# Patient Record
Sex: Male | Born: 1995 | Race: White | Hispanic: No | Marital: Single | Smoking: Current every day smoker
Health system: Southern US, Community
[De-identification: ages and names within clinical notes are randomized; demographics above are authoritative.]

## PROBLEM LIST (undated history)

## (undated) DIAGNOSIS — B1920 Unspecified viral hepatitis C without hepatic coma: Secondary | ICD-10-CM

## (undated) HISTORY — PX: TESTICLE SURGERY: SHX794

## (undated) HISTORY — PX: APPENDECTOMY: SHX54

---

## 2019-12-04 ENCOUNTER — Encounter (HOSPITAL_COMMUNITY): Payer: Self-pay

## 2019-12-04 ENCOUNTER — Other Ambulatory Visit: Payer: Self-pay

## 2019-12-04 ENCOUNTER — Emergency Department (HOSPITAL_COMMUNITY): Payer: Self-pay

## 2019-12-04 ENCOUNTER — Emergency Department (HOSPITAL_COMMUNITY)
Admission: EM | Admit: 2019-12-04 | Discharge: 2019-12-04 | Disposition: A | Discharge status: Home / Self Care | Payer: Self-pay | Attending: Emergency Medicine | Admitting: Emergency Medicine

## 2019-12-04 DIAGNOSIS — K6389 Other specified diseases of intestine: Secondary | ICD-10-CM

## 2019-12-04 DIAGNOSIS — K388 Other specified diseases of appendix: Secondary | ICD-10-CM | POA: Insufficient documentation

## 2019-12-04 HISTORY — DX: Unspecified viral hepatitis C without hepatic coma: B19.20

## 2019-12-04 LAB — URINALYSIS, ROUTINE W REFLEX MICROSCOPIC
Bilirubin Urine: NEGATIVE
Glucose, UA: NEGATIVE mg/dL
Hgb urine dipstick: NEGATIVE
Ketones, ur: NEGATIVE mg/dL
Nitrite: NEGATIVE
Protein, ur: NEGATIVE mg/dL
Specific Gravity, Urine: 1.026 (ref 1.005–1.030)
pH: 5 (ref 5.0–8.0)

## 2019-12-04 LAB — CBC
HCT: 43.5 % (ref 39.0–52.0)
Hemoglobin: 14.6 g/dL (ref 13.0–17.0)
MCH: 31.1 pg (ref 26.0–34.0)
MCHC: 33.6 g/dL (ref 30.0–36.0)
MCV: 92.6 fL (ref 80.0–100.0)
Platelets: 208 10*3/uL (ref 150–400)
RBC: 4.7 MIL/uL (ref 4.22–5.81)
RDW: 11.9 % (ref 11.5–15.5)
WBC: 7 10*3/uL (ref 4.0–10.5)
nRBC: 0 % (ref 0.0–0.2)

## 2019-12-04 LAB — COMPREHENSIVE METABOLIC PANEL
ALT: 66 U/L — ABNORMAL HIGH (ref 0–44)
AST: 53 U/L — ABNORMAL HIGH (ref 15–41)
Albumin: 4.6 g/dL (ref 3.5–5.0)
Alkaline Phosphatase: 157 U/L — ABNORMAL HIGH (ref 38–126)
Anion gap: 11 (ref 5–15)
BUN: 18 mg/dL (ref 6–20)
CO2: 25 mmol/L (ref 22–32)
Calcium: 9.4 mg/dL (ref 8.9–10.3)
Chloride: 103 mmol/L (ref 98–111)
Creatinine, Ser: 0.75 mg/dL (ref 0.61–1.24)
GFR calc Af Amer: >60 mL/min (ref >60)
GFR calc non Af Amer: >60 mL/min (ref >60)
Glucose, Bld: 91 mg/dL (ref 70–99)
Potassium: 4.6 mmol/L (ref 3.5–5.1)
Sodium: 139 mmol/L (ref 135–145)
Total Bilirubin: 1 mg/dL (ref 0.3–1.2)
Total Protein: 7.6 g/dL (ref 6.5–8.1)

## 2019-12-04 LAB — LIPASE, BLOOD: Lipase: 21 U/L (ref 11–51)

## 2019-12-04 MED ORDER — SODIUM CHLORIDE 0.9% FLUSH: 3.0000 mL | Freq: Once | INTRAVENOUS | Status: DC

## 2019-12-04 MED ORDER — IOHEXOL 300 MG/ML  SOLN
100.0000 mL | Freq: Once | INTRAMUSCULAR | Status: AC | PRN
  Administered 2019-12-04: 100 mL via INTRAVENOUS

## 2019-12-04 MED ORDER — SODIUM CHLORIDE (PF) 0.9 % IJ SOLN
INTRAMUSCULAR | Status: AC
  Filled 2019-12-04: qty 50

## 2019-12-04 NOTE — ED Triage Notes (Signed)
Patient c/o LLQ abdominal pain that began this AM around 0300 today. Patient denies any N/v/d. Patient states any movement causes him pain.

## 2019-12-04 NOTE — Discharge Instructions (Addendum)
Please take ibuprofen as needed for management of your pain.  I would recommend 600 to 800 mg 2-3 times per day.  Please take this medication with a small amount of food to reduce upset stomach.  Please return to the emergency department with any new or worsening symptoms.  I have given you a referral to Dr. Marin Olp.  He is a Development worker, international aid.  If you find your symptoms have not improved in the next 2 to 3 weeks feel free to give him a call to discuss your diagnosis.  It was a pleasure to meet you.

## 2019-12-04 NOTE — ED Provider Notes (Signed)
[facility]-EMERGENCY DEPT Provider Note   CSN: [id number] Arrival date & time: 12/04/19  0755     History Chief Complaint  Patient presents with   Abdominal Pain    Richard Pennington is a 24 y.o. male.  HPI HPI Comments: Richard Pennington is a 24 y.o. male who presents to the Emergency Department complaining of left lower quadrant abdominal pain that started about 9 hours ago.  Patient states he was lying in bed and rolled over in the middle the night began experiencing exquisite left lower abdominal pain.  His pain worsens with any movement, palpation, deep breathing, coughing.  He has not taken anything for his pain.  He denies any associated symptoms at this time.  No nausea, vomiting, diarrhea, constipation, fevers, chills, URI symptoms, chest pain, shortness of breath, dysuria, hematuria, difficulty urinating, syncope, penile pain, scrotal pain, penile swelling, scrotal swelling.  He reports a history of appendectomy as well as surgery for a left-sided varicocele.  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  He denies any recent drug use.     Past Medical History:  Diagnosis Date   Hepatitis C     There are no problems to display for this patient.   Past Surgical History:  Procedure Laterality Date   APPENDECTOMY     TESTICLE SURGERY         History reviewed. No pertinent family history.  Social History   Tobacco Use   Smoking status: Never Smoker  Vaping Use   Vaping Use: Every day   Substances: Nicotine, Flavoring  Substance Use Topics   Alcohol use: Never   Drug use: Not Currently    Comment: heroin    Home Medications Prior to Admission medications   Not on File    Allergies    Patient has no known allergies.  Review of Systems   Review of Systems  All other systems reviewed and are negative. Ten systems reviewed and are negative for acute  change, except as noted in the HPI.   Physical Exam Updated Vital Signs BP (!) 136/99 (BP Location: Left Arm)    Pulse 69    Temp (!) 97.5 F (36.4 C) (Oral)    Resp 19    Ht 6\' 1"  (1.854 m)    Wt 88.5 kg    SpO2 99%    BMI 25.73 kg/m   Physical Exam Vitals and nursing note reviewed.  Constitutional:      General: He is in acute distress.     Appearance: Normal appearance. He is well-developed and normal weight. He is not ill-appearing, toxic-appearing or diaphoretic.  HENT:     Head: Normocephalic and atraumatic.     Right Ear: External ear normal.     Left Ear: External ear normal.     Nose: Nose normal.     Mouth/Throat:     Mouth: Mucous membranes are moist.     Pharynx: Oropharynx is clear. No oropharyngeal exudate or posterior oropharyngeal erythema.  Eyes:     Extraocular Movements: Extraocular movements intact.  Cardiovascular:     Rate and Rhythm: Normal rate and regular rhythm.     Pulses: Normal pulses.     Heart sounds: Normal heart sounds. No murmur heard.  No friction rub. No gallop.   Pulmonary:     Effort: Pulmonary effort is normal. No respiratory distress.     Breath sounds:  Normal breath sounds. No stridor. No wheezing, rhonchi or rales.  Abdominal:     General: Abdomen is flat. A surgical scar is present. Bowel sounds are normal.     Tenderness: There is abdominal tenderness in the left lower quadrant. There is no right CVA tenderness or left CVA tenderness.     Hernia: No hernia is present.     Comments: Well-healed surgical scar noted to the right lower quadrant as well as left inguinal regions.  Exquisite TTP noted along the left lower quadrant.  No rebound.  No guarding.  Abdomen is soft.  Bowel sounds noted in all 4 abdominal quadrants.  Musculoskeletal:        General: Normal range of motion.     Cervical back: Normal range of motion and neck supple. No tenderness.  Skin:    General: Skin is warm and dry.  Neurological:     General: No focal  deficit present.     Mental Status: He is alert and oriented to person, place, and time.  Psychiatric:        Mood and Affect: Mood normal.        Behavior: Behavior normal.    ED Results / Procedures / Treatments   Labs (all labs ordered are listed, but only abnormal results are displayed) Labs Reviewed  COMPREHENSIVE METABOLIC PANEL - Abnormal; Notable for the following components:      Result Value   AST 53 (*)    ALT 66 (*)    Alkaline Phosphatase 157 (*)    All other components within normal limits  URINALYSIS, ROUTINE W REFLEX MICROSCOPIC - Abnormal; Notable for the following components:   Leukocytes,Ua TRACE (*)    Bacteria, UA RARE (*)    All other components within normal limits  LIPASE, BLOOD  CBC    EKG None  Radiology CT ABDOMEN PELVIS W CONTRAST  Result Date: 12/04/2019 CLINICAL DATA:  Left lower quadrant abdominal pain. EXAM: CT ABDOMEN AND PELVIS WITH CONTRAST TECHNIQUE: Multidetector CT imaging of the abdomen and pelvis was performed using the standard protocol following bolus administration of intravenous contrast. CONTRAST:  162mL OMNIPAQUE IOHEXOL 300 MG/ML  SOLN COMPARISON:  None. FINDINGS: Lower chest: No acute abnormality. Hepatobiliary: No focal liver abnormality is seen. No gallstones, gallbladder wall thickening, or biliary dilatation. Pancreas: Unremarkable. No pancreatic ductal dilatation or surrounding inflammatory changes. Spleen: Normal in size without focal abnormality. Adrenals/Urinary Tract: Adrenal glands are unremarkable. Kidneys are normal, without renal calculi, focal lesion, or hydronephrosis. Bladder is unremarkable. Stomach/Bowel: Stomach is within normal limits. History of prior appendectomy. No evidence of bowel wall thickening, distention, or inflammatory changes. Vascular/Lymphatic: No significant vascular findings are present. No enlarged abdominal or pelvic lymph nodes. Reproductive: Prostate is unremarkable. Other: Focal fat stranding  surrounding an epiploic appendage arising from the descending/sigmoid colon junction (series 2, image 65; series 4, image 32). Trace free fluid in the pelvis. No pneumoperitoneum. Musculoskeletal: No acute or significant osseous findings. IMPRESSION: 1. Acute epiploic appendagitis at the descending/sigmoid colon junction. Electronically Signed   By: Titus Dubin M.D.   On: 12/04/2019 13:22   Procedures Procedures   Medications Ordered in ED Medications  sodium chloride flush (NS) 0.9 % injection 3 mL (3 mLs Intravenous Not Given 12/04/19 1120)    ED Course  I have reviewed the triage vital signs and the nursing notes.  Pertinent labs & imaging results that were available during my care of the patient were reviewed by me and considered in my  medical decision making (see chart for details).    MDM Rules/Calculators/A&P                          Patient is a 24 year old male with a history of hepatitis C who presents with acute left lower quadrant pain.  Physical exam is generally reassuring but patient does have exquisite tenderness in the left lower quadrant with even mild palpation.  Labs are reassuring.  No leukocytosis.  Patient is afebrile.  Not tachycardic.  He is saturating well.  He does have a mild elevation in his AST at 53, ALT is 66, alkaline phosphatase of 157.  This seems consistent with his history of hepatitis C.  Trace leukocytes and rare bacteria in the urine.  No urinary complaints.  CT scan of the abdomen and pelvis was significant for an acute epiploic appendagitis at the descending/sigmoid colon junction.  This appears consistent with the patient's symptoms.  I discussed this with the patient.  I recommended a regimen of ibuprofen.  We discussed dosing.  He was given a referral for general surgery if you find that his symptoms have not improved in 2 to 3 weeks.  He was given return precautions.  His questions were answered and he was amicable to time of discharge.  His  vital signs are stable.  Patient discharged to home/self care.  Condition at discharge: Stable  Note: Portions of this report may have been transcribed using voice recognition software. Every effort was made to ensure accuracy; however, inadvertent computerized transcription errors may be present.    Final Clinical Impression(s) / ED Diagnoses Final diagnoses:  Epiploic appendagitis   Rx / DC Orders ED Discharge Orders    None       Placido Sou, PA-C 12/04/19 1416    Sabas Sous, MD 12/04/19 [phone]

## 2021-03-26 ENCOUNTER — Encounter: Payer: Self-pay | Admitting: Internal Medicine

## 2021-03-26 ENCOUNTER — Other Ambulatory Visit: Payer: Self-pay

## 2021-03-26 ENCOUNTER — Other Ambulatory Visit (HOSPITAL_COMMUNITY): Payer: Self-pay

## 2021-03-26 ENCOUNTER — Telehealth: Payer: Self-pay

## 2021-03-26 ENCOUNTER — Ambulatory Visit (INDEPENDENT_AMBULATORY_CARE_PROVIDER_SITE_OTHER): Payer: Self-pay | Admitting: Internal Medicine

## 2021-03-26 VITALS — BP 122/81 | HR 74 | Temp 98.4°F | Wt 200.0 lb

## 2021-03-26 DIAGNOSIS — B182 Chronic viral hepatitis C: Secondary | ICD-10-CM

## 2021-03-26 NOTE — Progress Notes (Signed)
[facility]                                      [address], [place], [place], [address]                                               Phn. 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 Pt was incarcerated when he found out about HCV status. Last injected heroin 2 years ago. He has been on methadone for 2 years. He lives at [facility] sober living. Has used intranasal cocaine in the past. Tattoos are post HCV diagnosis. Brother who is now sober had IVDA Hx and was HCV positive.   Denies  blood transfusion, sharing of toothbrushes/razors, or sexual contact with known positive partners, military service    He has not received treatment to date Denies any hospitalizations related to liver disease, jaundice, ascites, GI bleeding, mental status changes, abdominal pain and acholic stool.  ROS: Denies yellowish discoloration of sclera and skin, abdominal pain/distension, hematemesis.            Denies cough, fever, chills, nightsweats, nausea, vomiting, diarrhea, constipation, weight loss, recent hospitalizations, rashes, joint complaints, shortness of breath, chest pain, headaches, dysuria .  Current Outpatient Medications on File Prior to Visit  Medication Sig Dispense Refill   methadone (METHADONE HCL INTENSOL) 10 MG/ML solution Take by mouth every 8 (eight) hours.     No current facility-administered medications on file prior to visit.     Allergies  Allergen Reactions   Tree Extract Anaphylaxis    Past Medical History:  Diagnosis Date   Hepatitis C      Past Surgical History:  Procedure Laterality Date   APPENDECTOMY     TESTICLE SURGERY       Social History   Socioeconomic History   Marital status: Single     Spouse name: Not on file   Number of children: Not on file   Years of education: Not on file   Highest education level: Not on file  Occupational History   Not on file  Tobacco Use   Smoking status: Former    Types: Cigarettes   Smokeless tobacco: Never  Vaping Use   Vaping Use: Every day   Substances: Nicotine, Flavoring  Substance and Sexual Activity   Alcohol use: Not Currently   Drug use: Not Currently    Comment: heroin   Sexual activity: Not on file  Other Topics Concern   Not on file  Social History Narrative   Not on file    Physical exam: BP 122/81   Pulse 74   Temp 98.4 F (36.9 C) (Oral)   Wt 90.7 kg   SpO2 100%   BMI  26.39 kg/m   Gen: Alert and oriented x 3, no acute distress HEENT: [place]/AT, PERL, EOMI, no scleral icterus, no pale conjunctivae, hearing normal, oral mucosa moist Neck: Supple, no lymphadenopathy Cardio: Regular rate and rhythm; +S1 and S2; no murmurs, gallops, or rubs Resp: CTAB; no wheezes, rhonchi, or rales GI: Soft, nontender, nondistended, bowel sounds present GU: Musc: Extremities: No cyanosis, clubbing, or edema; +2 PT and DP pulses Skin: No rashes, lesions, or ecchymoses Neuro: No focal deficits Psych: Calm, cooperative   Assessment/Plan:  #Hepatitis C-chronic #Hx of IVDA -Dx 3 years ago, retested at [facility] on 11/23/19 and HCV ab positive Prior treatment: none Interested in treatment: yes -Pt last used IV drugs heroin 2 years ago. Has been on methadone x 2 years and lived at [place] [facility](sober living).  Plan: -Labs and imaging ordered: CBC w diff CMP PT/PTT/INR Hep A and B serologies  HIV  HCV genotype HCV RNA Measure of fibrosis U/S abdominal -Follow-up with pharmacy in 2 weeks to start treatment/review results and 4 weeks from starting treatment -Follow-up with ID provider a the end of treatment.   Counseling done on the following -Natural progression of hep c, transmission (avoid sharing personal hygiene  equipment), prevention, risks of left untreated and treatment options  -Avoid hepatotoxins like alcohol and excessive acetamaminphen (no more than 2 gram a day) -Avoid eating raw sea food -Risks of re-infection  -Hepatitis coinfection and vaccination( Pneumococcal vaccination in the cirrhotics     Electronically signed by:  Danelle Earthly, MD Infectious Diseases  Fax no. [phone]

## 2021-03-26 NOTE — Patient Instructions (Signed)
Return to clinic in 2 weeks to review labs/imaging and medication initiation.

## 2021-03-26 NOTE — Telephone Encounter (Signed)
[facility] Patient Advocate Encounter ? ?Insurance verification completed.   ? ?The patient is uninsured and will need patient assistance for medication. ? ?We can complete the application and will need to meet with the patient for signatures and income documentation. ? ?Hesper Venturella, CPhT ?Specialty Pharmacy Patient Advocate ?[facility] ?Phone: [phone] ?Fax:  [phone]  ?

## 2021-04-08 ENCOUNTER — Ambulatory Visit (HOSPITAL_COMMUNITY)
Admission: RE | Admit: 2021-04-08 | Discharge: 2021-04-08 | Disposition: A | Discharge status: Home / Self Care | Payer: Self-pay | Source: Ambulatory Visit | Attending: Internal Medicine | Admitting: Internal Medicine

## 2021-04-08 ENCOUNTER — Other Ambulatory Visit: Payer: Self-pay

## 2021-04-08 DIAGNOSIS — B182 Chronic viral hepatitis C: Secondary | ICD-10-CM | POA: Insufficient documentation

## 2021-04-09 ENCOUNTER — Ambulatory Visit (INDEPENDENT_AMBULATORY_CARE_PROVIDER_SITE_OTHER): Payer: Self-pay | Admitting: Internal Medicine

## 2021-04-09 ENCOUNTER — Other Ambulatory Visit: Payer: Self-pay

## 2021-04-09 ENCOUNTER — Encounter: Payer: Self-pay | Admitting: Internal Medicine

## 2021-04-09 VITALS — BP 128/94 | HR 78 | Temp 98.0°F | Wt 199.0 lb

## 2021-04-09 DIAGNOSIS — Z23 Encounter for immunization: Secondary | ICD-10-CM

## 2021-04-09 LAB — CBC WITH DIFFERENTIAL/PLATELET
Absolute Monocytes: 599 cells/uL (ref 200–950)
Basophils Absolute: 22 cells/uL (ref 0–200)
Basophils Relative: 0.4 %
Eosinophils Absolute: 140 cells/uL (ref 15–500)
Eosinophils Relative: 2.5 %
HCT: 42.4 % (ref 38.5–50.0)
Hemoglobin: 14.6 g/dL (ref 13.2–17.1)
Lymphs Abs: 1859 cells/uL (ref 850–3900)
MCH: 30.7 pg (ref 27.0–33.0)
MCHC: 34.4 g/dL (ref 32.0–36.0)
MCV: 89.1 fL (ref 80.0–100.0)
MPV: 9.9 fL (ref 7.5–12.5)
Monocytes Relative: 10.7 %
Neutro Abs: 2979 cells/uL (ref 1500–7800)
Neutrophils Relative %: 53.2 %
Platelets: 227 10*3/uL (ref 140–400)
RBC: 4.76 10*6/uL (ref 4.20–5.80)
RDW: 12.8 % (ref 11.0–15.0)
Total Lymphocyte: 33.2 %
WBC: 5.6 10*3/uL (ref 3.8–10.8)

## 2021-04-09 LAB — COMPREHENSIVE METABOLIC PANEL
AG Ratio: 1.8 (calc) (ref 1.0–2.5)
ALT: 40 U/L (ref 9–46)
AST: 33 U/L (ref 10–40)
Albumin: 4.8 g/dL (ref 3.6–5.1)
Alkaline phosphatase (APISO): 124 U/L (ref 36–130)
BUN: 12 mg/dL (ref 7–25)
CO2: 31 mmol/L (ref 20–32)
Calcium: 10.1 mg/dL (ref 8.6–10.3)
Chloride: 102 mmol/L (ref 98–110)
Creat: 0.74 mg/dL (ref 0.60–1.24)
Globulin: 2.6 g/dL (calc) (ref 1.9–3.7)
Glucose, Bld: 86 mg/dL (ref 65–99)
Potassium: 4.5 mmol/L (ref 3.5–5.3)
Sodium: 139 mmol/L (ref 135–146)
Total Bilirubin: 0.4 mg/dL (ref 0.2–1.2)
Total Protein: 7.4 g/dL (ref 6.1–8.1)

## 2021-04-09 LAB — LIVER FIBROSIS, FIBROTEST-ACTITEST
ALT: 41 U/L (ref 9–46)
Alpha-2-Macroglobulin: 218 mg/dL (ref 106–279)
Apolipoprotein A1: 131 mg/dL (ref 94–176)
Bilirubin: 0.5 mg/dL (ref 0.2–1.2)
Fibrosis Score: 0.24
GGT: 41 U/L (ref 3–70)
Haptoglobin: 70 mg/dL (ref 43–212)
Necroinflammat ACT Score: 0.21
Reference ID: 4071041

## 2021-04-09 LAB — AFP TUMOR MARKER: AFP-Tumor Marker: 3.1 ng/mL (ref <6.1)

## 2021-04-09 LAB — HCV RNA, QUANT REAL-TIME PCR W/REFLEX
HCV RNA, PCR, QN (Log): 5.91 LogIU/mL — ABNORMAL HIGH
HCV RNA, PCR, QN: 809000 IU/mL — ABNORMAL HIGH

## 2021-04-09 LAB — HEPATITIS B SURFACE ANTIGEN: Hepatitis B Surface Ag: NONREACTIVE

## 2021-04-09 LAB — PROTIME-INR
INR: 0.9
Prothrombin Time: 9.5 s (ref 9.0–11.5)

## 2021-04-09 LAB — HIV ANTIBODY (ROUTINE TESTING W REFLEX): HIV 1&2 Ab, 4th Generation: NONREACTIVE

## 2021-04-09 LAB — HEPATITIS B SURFACE ANTIBODY,QUALITATIVE: Hep B S Ab: REACTIVE — AB

## 2021-04-09 LAB — HCV RNA,LIPA RFLX NS5A DRUG RESIST: HCV Genotype: 3

## 2021-04-09 LAB — HEPATITIS B CORE ANTIBODY, TOTAL: Hep B Core Total Ab: NONREACTIVE

## 2021-04-09 LAB — HEPATITIS A ANTIBODY, TOTAL: Hepatitis A AB,Total: NONREACTIVE

## 2021-04-09 NOTE — Patient Instructions (Signed)
-  Return to clinic in 2 weeks to pick up meds and visit with pharmacy

## 2021-04-09 NOTE — Progress Notes (Signed)
[facility]                                      [address], [place], [place], [address]                                               Phn. [phone]; Fax: [phone]                                                          Reason for Visit: Hepatitis C    HPI: Richard Pennington is a 25 y.o.old male with IVDA (used over 2 years ago)presents for HCV care. He reports he is ready to start treatment.   ROS: Denies yellowish discoloration of sclera and skin, abdominal pain/distension, hematemesis.  Denis cough, fever, chills, nightsweats, nausea, vomiting, diarrhea, constipation, weight loss, recent hospitalizations, rashes, joint complaints, shortness of breath, chest pain, headaches, dysuria .   Current Outpatient Medications on File Prior to Visit  Medication Sig Dispense Refill   methadone (DOLOPHINE) 10 MG/ML solution Take by mouth every 8 (eight) hours.     No current facility-administered medications on file prior to visit.   Allergies  Allergen Reactions   Tree Extract Anaphylaxis    Past Medical History:  Diagnosis Date   Hepatitis C     Past Surgical History:  Procedure Laterality Date   APPENDECTOMY     TESTICLE SURGERY      Social History   Socioeconomic History   Marital status: Single    Spouse name: Not on file   Number of children: Not on file   Years of education: Not on file   Highest education level: Not on file  Occupational History   Not on file  Tobacco Use   Smoking status: Former    Types: Cigarettes   Smokeless tobacco: Never  Vaping Use   Vaping Use: Every day   Substances: Nicotine, Flavoring  Substance and Sexual Activity   Alcohol use: Not Currently   Drug use: Not Currently    Comment: heroin   Sexual activity: Not on file  Other Topics Concern   Not on file  Social History Narrative   Not on file   Social Determinants of Health   Financial Resource  Strain: Not on file  Food Insecurity: Not on file  Transportation Needs: Not on file  Physical Activity: Not on file  Stress: Not on file  Social Connections: Not on file  Intimate Partner Violence: Not on file    No family history on file.  Physical exam: BP (!) 128/94   Pulse 78   Temp 98 F (36.7 C) (Oral)   Wt 90.3 kg   SpO2 98%   BMI 26.25 kg/m   Gen: Alert and oriented x 3, no acute distress HEENT: [place]/AT, PERL, EOMI, no scleral icterus, no pale conjunctivae, hearing normal, oral mucosa moist Neck: Supple, no lymphadenopathy Cardio: Regular rate and rhythm; +S1 and S2; no murmurs, gallops, or rubs Resp: CTAB; no wheezes, rhonchi, or rales GI: Soft, nontender, nondistended, bowel sounds present GU:  Musc: Extremities: No cyanosis, clubbing, or edema; +2 PT and DP pulses Skin: No rashes, lesions, or ecchymoses Neuro: No focal deficits Psych: Calm, cooperative   Laboratory  CBC w diff-03/26/21 CMP- 03/26/21 PT/PTT/INR-03/26/21 Hep A- order vaccine today 04/09/21 HBV immune:03/26/21 HIV: negative 03/26/21 HCV genotype-3: 03/26/21 HCV RNA-809,000 on 03/26/21 Measure of fibrosis-fibrotest F0-F1 on 03/26/21  Assessment/Plan:  #Hepatitis C-Chronic Prior treatment: none GT: 3 Evidence of cirrhosis: none. U/S on 04/08/21: possible fatty infiltration, no focal liver lesions Interested in treatment: Yes Fibrosis stage: Fo-F1 no fibrosis Plan: -HAV vaccine today -Filled out prior auth for mavyret x 8 weeks -Next visit with pharmcy in 2 weeks to pick up meds and counseling. Obtain lipid panel at next visit -Vist with ID provider at the end of treatment  #Hx of IVDA -Pt last used IV drugs heroin 2 years ago. Has been on methadone x 2 years and lived at [place] house(sober living).     Counseling done on the following -Natural progression of hep c, transmission (avoid sharing personal hygiene equipment), prevention, risks of left untreated and treatment options   -Avoid hepatotoxins like alcohol and excessive acetamaminphen (no more than 2 gram a day) -Avoid eating raw sea food -Risks of re-infection  -Hepatitis coinfection and vaccination   Electronically signed by:  Danelle Earthly, MD Infectious Diseases  Fax no. [phone]

## 2021-04-10 ENCOUNTER — Telehealth: Payer: Self-pay

## 2021-04-10 NOTE — Telephone Encounter (Signed)
[facility] Patient Advocate Encounter  Completed and sent MY[employer]IE application for Mavyret for this patient who is uninsured.    Patient assistance phone number for follow up is [phone].   This encounter will be updated until final determination.   Eilis Chestnutt, CPhT Specialty Pharmacy Patient Advocate [facility] Phone: [phone] Fax:  [phone]  

## 2021-04-17 ENCOUNTER — Telehealth: Payer: Self-pay

## 2021-04-17 NOTE — Telephone Encounter (Signed)
Will counsel on Mavyret at appointment. Thanks!

## 2021-04-17 NOTE — Telephone Encounter (Signed)
[facility] Patient Advocate Encounter  Completed and sent MYABBVIE application for Mavyret for this patient who is uninsured.    Patient is approved 04/15/21 through 10/13/21.  Medication will be delivered to the clinic on 04/22/21.    Clearance Coots, CPhT Specialty Pharmacy Patient [facility] Phone: [phone] Fax:  [phone]

## 2021-04-22 ENCOUNTER — Telehealth: Payer: Self-pay

## 2021-04-22 NOTE — Telephone Encounter (Addendum)
[facility] Patient Advocate Encounter  Patient's medications have been couriered to [facility] from [facility] and will be picked up 04/23/21.  Clearance Coots , CPhT Specialty Pharmacy Patient [facility] Phone: [phone] Fax:  [phone]

## 2021-04-23 ENCOUNTER — Ambulatory Visit (INDEPENDENT_AMBULATORY_CARE_PROVIDER_SITE_OTHER): Payer: Self-pay | Admitting: Pharmacist

## 2021-04-23 ENCOUNTER — Other Ambulatory Visit: Payer: Self-pay

## 2021-04-23 DIAGNOSIS — B182 Chronic viral hepatitis C: Secondary | ICD-10-CM

## 2021-04-23 MED ORDER — MAVYRET 100-40 MG PO TABS: 3.0000 | ORAL_TABLET | Freq: Every day | ORAL | 1 refills | Status: DC

## 2021-04-23 NOTE — Progress Notes (Signed)
Richard Pennington presents to clinic today to pick up his Hepatitis C medication. He will be starting on Mavyret for 8 weeks.   Patient is approved to receive Mavyret x 8 weeks for chronic Hepatitis C infection. Counseled patient to take all three tablets of Mavyret daily with food.  Counseled patient the need to take all three tablets together and to not separate them out during the day. Encouraged patient not to miss any doses and explained how their chance of cure could go down with each dose missed. Counseled patient on what to do if dose is missed - if it is closer to the missed dose take immediately; if closer to next dose then skip dose and take the next dose at the usual time. Counseled patient on common side effects such as headache, fatigue, and nausea and that these normally decrease with time. I reviewed patient medications and found no drug interactions. Discussed with patient that there are several drug interactions with Mavyret and instructed patient to call the clinic if he wishes to start a new medication during course of therapy. Also advised patient to call if he experiences any side effects. Patient will follow-up with Marchelle Folks in 1 month on 05/23/21 @ 1:45.   Shirlee More, PharmD PGY2 Infectious Diseases Pharmacy Resident

## 2021-05-16 ENCOUNTER — Telehealth: Payer: Self-pay

## 2021-05-16 NOTE — Telephone Encounter (Signed)
[facility] Patient Advocate Encounter  Patient's medications have been couriered to [facility] from [facility] and will be picked up 05/16/21.  Clearance Coots , CPhT Specialty Pharmacy Patient [facility] Phone: [phone] Fax:  [phone]

## 2021-05-23 ENCOUNTER — Ambulatory Visit (INDEPENDENT_AMBULATORY_CARE_PROVIDER_SITE_OTHER): Payer: Self-pay | Admitting: Pharmacist

## 2021-05-23 ENCOUNTER — Other Ambulatory Visit: Payer: Self-pay

## 2021-05-23 DIAGNOSIS — B182 Chronic viral hepatitis C: Secondary | ICD-10-CM

## 2021-05-23 NOTE — Progress Notes (Signed)
HPI: Richard Pennington is a 25 y.o. male who presents to the [facility] pharmacy clinic for Hepatitis C follow-up.  Medication: Mavyret x 8 weeks  Start Date: 04/23/21  Hepatitis C Genotype: 3  Fibrosis Score: F0-F1  Hepatitis C RNA: 809,000 (03/26/21)  There are no problems to display for this patient.   Patient's Medications  New Prescriptions   No medications on file  Previous Medications   GLECAPREVIR-PIBRENTASVIR (MAVYRET) 100-40 MG TABS    Take 3 tablets by mouth daily with breakfast.   METHADONE (DOLOPHINE) 10 MG/ML SOLUTION    Take by mouth every 8 (eight) hours.  Modified Medications   No medications on file  Discontinued Medications   No medications on file    Allergies: Allergies  Allergen Reactions   Tree Extract Anaphylaxis    Past Medical History: Past Medical History:  Diagnosis Date   Hepatitis C     Social History: Social History   Socioeconomic History   Marital status: Single    Spouse name: Not on file   Number of children: Not on file   Years of education: Not on file   Highest education level: Not on file  Occupational History   Not on file  Tobacco Use   Smoking status: Former    Types: Cigarettes   Smokeless tobacco: Never  Vaping Use   Vaping Use: Every day   Substances: Nicotine, Flavoring  Substance and Sexual Activity   Alcohol use: Not Currently   Drug use: Not Currently    Comment: heroin   Sexual activity: Not on file  Other Topics Concern   Not on file  Social History Narrative   Not on file   Social Determinants of Health   Financial Resource Strain: Not on file  Food Insecurity: Not on file  Transportation Needs: Not on file  Physical Activity: Not on file  Stress: Not on file  Social Connections: Not on file    Labs: Hepatitis C Lab Results  Component Value Date   HCVGENOTYPE 3 03/26/2021   HCVRNAPCRQN 809,000 (H) 03/26/2021   FIBROSTAGE F0-F1 03/26/2021   Hepatitis B Lab Results  Component Value  Date   HEPBSAB REACTIVE (A) 03/26/2021   HEPBSAG NON-REACTIVE 03/26/2021   HEPBCAB NON-REACTIVE 03/26/2021   Hepatitis A Lab Results  Component Value Date   HAV NON-REACTIVE 03/26/2021   HIV Lab Results  Component Value Date   HIV NON-REACTIVE 03/26/2021   Lab Results  Component Value Date   CREATININE 0.74 03/26/2021   CREATININE 0.75 12/04/2019   Lab Results  Component Value Date   AST 33 03/26/2021   AST 53 (H) 12/04/2019   ALT 41 03/26/2021   ALT 40 03/26/2021   ALT 66 (H) 12/04/2019   INR 0.9 03/26/2021    Assessment: Richard Pennington presents to clinic today for HCV follow-up. He has been taking Mavyret for 4 weeks and is tolerating the medicine well. He has not missed any doses. He takes his medicine with his dinner each evening. Will check HCV RNA today and follow-up with Dr. Thedore Mins in 4 weeks. Of note, he states he has experienced mild nausea, dizziness, and muscle soreness. He stated he has no problem completed his treatment course with these ongoing side effects. He also already picked up his second month of medicine last week and started it two days ago.   Plan: Check HCV RNA Continue Mavyret x 4 weeks Follow-up with Dr. Thedore Mins on 1/13  Margarite Gouge, PharmD, CPP Clinical Pharmacist Practitioner  Infectious Diseases Clinical Pharmacist [facility] 05/23/2021, 2:09 PM

## 2021-05-27 LAB — HEPATITIS C RNA QUANTITATIVE
HCV Quantitative Log: <1.18 log IU/mL
HCV RNA, PCR, QN: <15 IU/mL

## 2021-06-24 ENCOUNTER — Telehealth: Payer: Self-pay

## 2021-06-24 NOTE — Telephone Encounter (Signed)
Patient called requesting to go over labs from last appointment, relayed that his HCV viral load was undetectable which indicates his medication is working. Reminded him of follow up visit. Patient verbalized understanding and has no further questions.   Sandie Ano, RN

## 2021-06-27 ENCOUNTER — Ambulatory Visit: Payer: Medicaid Other | Admitting: Internal Medicine

## 2021-07-07 ENCOUNTER — Encounter: Payer: Self-pay | Admitting: Internal Medicine

## 2021-07-07 ENCOUNTER — Other Ambulatory Visit: Payer: Self-pay

## 2021-07-07 ENCOUNTER — Ambulatory Visit (INDEPENDENT_AMBULATORY_CARE_PROVIDER_SITE_OTHER): Payer: Self-pay | Admitting: Internal Medicine

## 2021-07-07 VITALS — BP 126/86 | HR 82 | Temp 98.3°F | Ht 73.0 in | Wt 195.0 lb

## 2021-07-07 DIAGNOSIS — B182 Chronic viral hepatitis C: Secondary | ICD-10-CM

## 2021-07-07 NOTE — Progress Notes (Signed)
There are no problems to display for this patient.   Patient's Medications  New Prescriptions   No medications on file  Previous Medications   GLECAPREVIR-PIBRENTASVIR (MAVYRET) 100-40 MG TABS    Take 3 tablets by mouth daily with breakfast.   METHADONE (DOLOPHINE) 10 MG/ML SOLUTION    Take by mouth every 8 (eight) hours.  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 He completed 8 weeks of Mavyret on 06/23/21. Reports 100% adherence.    ROS: Denies yellowish discoloration of sclera and skin, abdominal pain/distension, hematemesis.  Denis cough, fever, chills, nightsweats, nausea, vomiting, diarrhea, constipation, weight loss, recent hospitalizations, rashes, joint complaints, shortness of breath, chest pain, headaches, dysuria .  Review of Systems: Review of Systems  All other systems reviewed and are negative.  Past Medical History:  Diagnosis Date   Hepatitis C     Social History   Tobacco Use   Smoking status: Every Day    Packs/day: 0.10    Types: Cigarettes   Smokeless tobacco: Never   Tobacco comments:    2 cigarettes a day  Vaping Use   Vaping Use: Every day   Substances: Nicotine, Flavoring  Substance Use Topics   Alcohol use: Not Currently   Drug use: Not Currently    Comment: heroin    No family history on file.  Allergies  Allergen Reactions   Tree Extract Anaphylaxis    Health Maintenance  Topic Date Due   COVID-19 Vaccine (1) Never done   HPV VACCINES (1 - Male 2-dose series) Never done   TETANUS/TDAP  Never done   INFLUENZA VACCINE  Never done   Hepatitis C Screening  Completed   HIV Screening  Completed    Objective:  Vitals:   07/07/21 1335  BP: 126/86  Pulse: 82  Temp: 98.3 F (36.8 C)  TempSrc: Oral  SpO2: 97%  Weight: 195 lb (88.5 kg)  Height: 6'  1" (1.854 m)   Body mass index is 25.73 kg/m.  Physical Exam Constitutional:      General: He is not in acute distress.    Appearance: He is normal weight. He is not toxic-appearing.  HENT:     Head: Normocephalic and atraumatic.     Right Ear: External ear normal.     Left Ear: External ear normal.     Nose: No congestion or rhinorrhea.     Mouth/Throat:     Mouth: Mucous membranes are moist.     Pharynx: Oropharynx is clear.  Eyes:     Extraocular Movements: Extraocular movements intact.     Conjunctiva/sclera: Conjunctivae normal.     Pupils: Pupils are equal, round, and reactive to light.  Cardiovascular:     Rate and Rhythm: Normal rate and regular rhythm.     Heart sounds: No murmur heard.   No friction rub. No gallop.  Pulmonary:     Effort: Pulmonary effort is normal.     Breath sounds: Normal breath sounds.  Abdominal:     General: Abdomen is flat. Bowel sounds are normal.     Palpations: Abdomen is soft.  Musculoskeletal:        General: No swelling. Normal range of motion.     Cervical back: Normal range of motion and neck supple.  Skin:    General: Skin is warm and dry.  Neurological:     General: No focal deficit present.     Mental Status: He is oriented to person, place, and time.  Psychiatric:        Mood and Affect: Mood normal.    Lab Results Lab Results  Component Value Date   WBC 5.6 03/26/2021   HGB 14.6 03/26/2021   HCT 42.4 03/26/2021   MCV 89.1 03/26/2021   PLT 227 03/26/2021    Lab Results  Component Value Date   CREATININE 0.74 03/26/2021   BUN 12 03/26/2021   NA 139 03/26/2021   K 4.5 03/26/2021   CL 102 03/26/2021   CO2 31 03/26/2021    Lab Results  Component Value Date   ALT 41 03/26/2021   ALT 40 03/26/2021   AST 33 03/26/2021   GGT 41 03/26/2021   ALKPHOS 157 (H) 12/04/2019   BILITOT 0.4 03/26/2021    No results found for: CHOL, HDL, LDLCALC, LDLDIRECT, TRIG, CHOLHDL No results found for: LABRPR, RPRTITER No  results found for: HIV1RNAQUANT, HIV1RNAVL, CD4TABS Laboratory  CBC w diff-03/26/21 CMP- 03/26/21 PT/PTT/INR-03/26/21 Hep A- order vaccine today 04/09/21 HBV immune:03/26/21 HIV: negative 03/26/21 HCV genotype-3: 03/26/21 HCV RNA-809,000 on 03/26/21, <15 on 05/23/21 Measure of fibrosis-fibrotest F0-F1 on 03/26/21   Assessment/Plan: #Hepatitis C-Chronic SP mavyret x 8 weeks -HCV VL <15 on 05/23/22(4 weeks). Completed 8 weeks on 06/24/21 Prior treatment: none GT: 3 Evidence of cirrhosis: none. 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 Has been on methadone x 2 years and lived at [facility](sober living).        Counseling done on the following -Natural progression of hep c, transmission (avoid sharing personal hygiene equipment), prevention, risks of left untreated and treatment options  -Avoid hepatotoxins like alcohol and excessive acetamaminphen (no more than 2 gram a day) -Avoid eating raw sea food -Risks of re-infection  -Hepatitis coinfection and vaccination   Laurice Record, MD [facility] 07/07/2021, 1:48 PM

## 2021-07-09 LAB — COMPLETE METABOLIC PANEL WITH GFR
AG Ratio: 1.7 (calc) (ref 1.0–2.5)
ALT: 11 U/L (ref 9–46)
AST: 17 U/L (ref 10–40)
Albumin: 4.6 g/dL (ref 3.6–5.1)
Alkaline phosphatase (APISO): 104 U/L (ref 36–130)
BUN: 16 mg/dL (ref 7–25)
CO2: 32 mmol/L (ref 20–32)
Calcium: 10 mg/dL (ref 8.6–10.3)
Chloride: 101 mmol/L (ref 98–110)
Creat: 0.95 mg/dL (ref 0.60–1.24)
Globulin: 2.7 g/dL (calc) (ref 1.9–3.7)
Glucose, Bld: 78 mg/dL (ref 65–99)
Potassium: 4.5 mmol/L (ref 3.5–5.3)
Sodium: 138 mmol/L (ref 135–146)
Total Bilirubin: 0.5 mg/dL (ref 0.2–1.2)
Total Protein: 7.3 g/dL (ref 6.1–8.1)
eGFR: 115 mL/min/{1.73_m2} (ref >=60)

## 2021-07-09 LAB — HEPATITIS C RNA QUANTITATIVE
HCV Quantitative Log: <1.18 log IU/mL
HCV RNA, PCR, QN: <15 IU/mL

## 2021-07-09 LAB — CBC WITH DIFFERENTIAL/PLATELET
Absolute Monocytes: 573 cells/uL (ref 200–950)
Basophils Absolute: 19 cells/uL (ref 0–200)
Basophils Relative: 0.3 %
Eosinophils Absolute: 113 cells/uL (ref 15–500)
Eosinophils Relative: 1.8 %
HCT: 42.4 % (ref 38.5–50.0)
Hemoglobin: 14.6 g/dL (ref 13.2–17.1)
Lymphs Abs: 1985 cells/uL (ref 850–3900)
MCH: 30.7 pg (ref 27.0–33.0)
MCHC: 34.4 g/dL (ref 32.0–36.0)
MCV: 89.3 fL (ref 80.0–100.0)
MPV: 10.6 fL (ref 7.5–12.5)
Monocytes Relative: 9.1 %
Neutro Abs: 3610 cells/uL (ref 1500–7800)
Neutrophils Relative %: 57.3 %
Platelets: 249 10*3/uL (ref 140–400)
RBC: 4.75 10*6/uL (ref 4.20–5.80)
RDW: 12.3 % (ref 11.0–15.0)
Total Lymphocyte: 31.5 %
WBC: 6.3 10*3/uL (ref 3.8–10.8)

## 2021-09-23 ENCOUNTER — Encounter: Payer: Self-pay | Admitting: Emergency Medicine

## 2021-09-23 ENCOUNTER — Ambulatory Visit
Admission: EM | Admit: 2021-09-23 | Discharge: 2021-09-23 | Disposition: A | Discharge status: Home / Self Care | Payer: Medicaid Other | Attending: Emergency Medicine | Admitting: Emergency Medicine

## 2021-09-23 DIAGNOSIS — B9689 Other specified bacterial agents as the cause of diseases classified elsewhere: Secondary | ICD-10-CM

## 2021-09-23 DIAGNOSIS — J302 Other seasonal allergic rhinitis: Secondary | ICD-10-CM

## 2021-09-23 DIAGNOSIS — H109 Unspecified conjunctivitis: Secondary | ICD-10-CM

## 2021-09-23 DIAGNOSIS — Z209 Contact with and (suspected) exposure to unspecified communicable disease: Secondary | ICD-10-CM

## 2021-09-23 MED ORDER — CIPROFLOXACIN HCL 0.3 % OP SOLN: OPHTHALMIC | 0 refills | Status: AC

## 2021-09-23 MED ORDER — CETIRIZINE HCL 10 MG PO TABS: 10.0000 mg | ORAL_TABLET | Freq: Every day | ORAL | 2 refills | Status: AC

## 2021-09-23 MED ORDER — OLOPATADINE HCL 0.2 % OP SOLN: 1.0000 [drp] | Freq: Every day | OPHTHALMIC | 0 refills | Status: AC

## 2021-09-23 MED ORDER — FLUTICASONE PROPIONATE 50 MCG/ACT NA SUSP: 1.0000 | Freq: Every day | NASAL | 1 refills | Status: AC

## 2021-09-23 NOTE — ED Provider Notes (Signed)
?[facility] ? ? ? ?CSN: [id number] ?Arrival date & time: 09/23/21  1226 ?  ? ?HISTORY  ? ?Chief Complaint  ?Patient presents with  ? Eye Pain  ? ?HPI ?Richard Pennington is a 26 y.o. male. Patient reports being exposed to bacterial conjunctivitis, states his girlfriend's daughter had it last week and he is pretty sure he caught it from her.  Patient states his bilateral eye pain began last night, denies traumatic injury to eye.  Patient states that the pain, redness and discharge began in his left eye and has since migrated to his right.  Patient states has not tried anything to relieve his symptoms.  Patient states he has had "copious" drainage from both eyes.  Patient states he is also sensitive to light, request lights in the room dimmed during our visit today. ? ?The history is provided by the patient.  ?Past Medical History:  ?Diagnosis Date  ? Hepatitis C   ? ?There are no problems to display for this patient. ? ?Past Surgical History:  ?Procedure Laterality Date  ? APPENDECTOMY    ? TESTICLE SURGERY    ? ? ?Home Medications   ? ?Prior to Admission medications   ?Medication Sig Start Date End Date Taking? Authorizing Provider  ?methadone (DOLOPHINE) 10 MG/ML solution Take by mouth every 8 (eight) hours.   Yes [provider]  ? ?Family History ?History reviewed. No pertinent family history. ?Social History ?Social History  ? ?Tobacco Use  ? Smoking status: Every Day  ?  Packs/day: 0.10  ?  Types: Cigarettes  ? Smokeless tobacco: Never  ? Tobacco comments:  ?  2 cigarettes a day  ?Vaping Use  ? Vaping Use: Every day  ? Substances: Nicotine, Flavoring  ?Substance Use Topics  ? Alcohol use: Not Currently  ? Drug use: Not Currently  ?  Comment: heroin  ? ?Allergies   ?Tree extract ? ?Review of Systems ?Review of Systems ?Pertinent findings noted in history of present illness.  ? ?Physical Exam ?Triage Vital Signs ?ED Triage Vitals  ?Enc Vitals Group  ?   BP 04/11/21 0827 (!) 147/82  ?    Pulse Rate 04/11/21 0827 72  ?   Resp 04/11/21 0827 18  ?   Temp 04/11/21 0827 98.3 ?F (36.8 ?C)  ?   Temp Source 04/11/21 0827 Oral  ?   SpO2 04/11/21 0827 98 %  ?   Weight --   ?   Height --   ?   Head Circumference --   ?   Peak Flow --   ?   Pain Score 04/11/21 0826 5  ?   Pain Loc --   ?   Pain Edu? --   ?   Excl. in GC? --   ?No data found. ? ?Updated Vital Signs ?BP 101/68 (BP Location: Left Arm)   Pulse 71   Temp 98 ?F (36.7 ?C) (Oral)   Resp 12   SpO2 95%  ? ?Physical Exam ?Vitals and nursing note reviewed.  ?Constitutional:   ?   General: He is not in acute distress. ?   Appearance: Normal appearance. He is not ill-appearing.  ?HENT:  ?   Head: Normocephalic and atraumatic.  ?   Salivary Glands: Right salivary gland is not diffusely enlarged or tender. Left salivary gland is not diffusely enlarged or tender.  ?   Right Ear: Hearing, tympanic membrane, ear canal and external ear normal. No drainage. No middle ear effusion. There  is no impacted cerumen. Tympanic membrane is not erythematous or bulging.  ?   Left Ear: Hearing, tympanic membrane, ear canal and external ear normal. No drainage.  No middle ear effusion. There is no impacted cerumen. Tympanic membrane is not erythematous or bulging.  ?   Nose: Mucosal edema, congestion and rhinorrhea present. No nasal deformity or septal deviation.  ?   Right Turbinates: Not enlarged, swollen or pale.  ?   Left Turbinates: Not enlarged, swollen or pale.  ?   Right Sinus: No maxillary sinus tenderness or frontal sinus tenderness.  ?   Left Sinus: No maxillary sinus tenderness or frontal sinus tenderness.  ?   Mouth/Throat:  ?   Lips: Pink. No lesions.  ?   Mouth: Mucous membranes are moist. No oral lesions.  ?   Tongue: No lesions.  ?   Palate: No mass.  ?   Pharynx: Oropharynx is clear. Uvula midline. No pharyngeal swelling, oropharyngeal exudate, posterior oropharyngeal erythema or uvula swelling.  ?   Tonsils: No tonsillar exudate. 0 on the right. 0 on the  left.  ?Eyes:  ?   General: Lids are normal. Lids are everted, no foreign bodies appreciated. Vision grossly intact. Gaze aligned appropriately. No allergic shiner, visual field deficit or scleral icterus.    ?   Right eye: No foreign body, discharge or hordeolum.     ?   Left eye: No foreign body, discharge or hordeolum.  ?   Extraocular Movements: Extraocular movements intact.  ?   Conjunctiva/sclera:  ?   Right eye: Right conjunctiva is injected. No chemosis, exudate or hemorrhage. ?   Left eye: Left conjunctiva is injected. No chemosis, exudate or hemorrhage. ?Neck:  ?   Trachea: Trachea and phonation normal.  ?Cardiovascular:  ?   Rate and Rhythm: Normal rate and regular rhythm.  ?   Pulses: Normal pulses.  ?   Heart sounds: Normal heart sounds. No murmur heard. ?  No friction rub. No gallop.  ?Pulmonary:  ?   Effort: Pulmonary effort is normal. No accessory muscle usage, prolonged expiration or respiratory distress.  ?   Breath sounds: Normal breath sounds. No stridor, decreased air movement or transmitted upper airway sounds. No decreased breath sounds, wheezing, rhonchi or rales.  ?Chest:  ?   Chest wall: No tenderness.  ?Musculoskeletal:     ?   General: Normal range of motion.  ?   Cervical back: Full passive range of motion without pain, normal range of motion and neck supple. Normal range of motion.  ?Lymphadenopathy:  ?   Cervical: No cervical adenopathy.  ?Skin: ?   General: Skin is warm and dry.  ?   Findings: No erythema or rash.  ?Neurological:  ?   General: No focal deficit present.  ?   Mental Status: He is alert and oriented to person, place, and time.  ?Psychiatric:     ?   Mood and Affect: Mood normal.     ?   Behavior: Behavior normal.  ? ? ?Visual Acuity ?Right Eye Distance:   ?Left Eye Distance:   ?Bilateral Distance:   ? ?Right Eye Near:   ?Left Eye Near:    ?Bilateral Near:    ? ?UC Couse / Diagnostics / Procedures:  ?  ?EKG ? ?Radiology ?No results found. ? ?Procedures ?Procedures  (including critical care time) ? ?UC Diagnoses / Final Clinical Impressions(s)   ?I have reviewed the triage vital signs and the nursing notes. ? ?  Pertinent labs & imaging results that were available during my care of the patient were reviewed by me and considered in my medical decision making (see chart for details).   ?Final diagnoses:  ?Seasonal allergic rhinitis, unspecified trigger  ?Bacterial conjunctivitis of both eyes  ?Contact with and suspected exposure to communicable disease  ? ?Based on physical exam findings, patient is clearly suffering from a seasonal allergic rhinitis.  Given that he has had contact with a child with diagnosed bacterial conjunctivitis even though I do not appreciate any thick or purulent discharge on exam today, I will provide patient with antibiotic eyedrops in addition to Pataday eyedrops, Flonase and cetirizine.  Return precautions advised, patient advised to follow-up with ophthalmology for worsening eye pain or lack of resolution of symptoms. ? ?ED Prescriptions   ? ? Medication Sig Dispense Auth. Provider  ? Olopatadine HCl (PATADAY) 0.2 % SOLN Apply 1 drop to eye daily. 2.5 mL Theadora Rama Scales, PA-C  ? fluticasone (FLONASE) 50 MCG/ACT nasal spray Place 1 spray into both nostrils daily. Begin by using 2 sprays in each nare daily for 3 to 5 days, then decrease to 1 spray in each nare daily. 32 mL Theadora Rama Scales, PA-C  ? cetirizine (ZYRTEC ALLERGY) 10 MG tablet Take 1 tablet (10 mg total) by mouth at bedtime. 30 tablet Theadora Rama Scales, PA-C  ? ciprofloxacin (CILOXAN) 0.3 % ophthalmic solution Administer 1 drop in both eyes, every 2 hours, while awake, for 2 days. Then 1 drop, every 4 hours, while awake, for the next 5 days. 10 mL Theadora Rama Scales, PA-C  ? ?  ? ?PDMP not reviewed this encounter. ? ?Pending results:  ?Labs Reviewed - No data to display ? ?Medications Ordered in UC: ?Medications - No data to display ? ?Disposition Upon Discharge:   ?Condition: stable for discharge home ?Home: take medications as prescribed; routine discharge instructions as discussed; follow up as advised. ? ?Patient presented with an acute illness with associated systemic symptoms a

## 2021-09-23 NOTE — Discharge Instructions (Addendum)
To treat you for likely bacterial conjunctivitis given your exposure to bacterial conjunctivitis, please begin ciprofloxacin eye drops as prescribed.  Please read the instructions carefully because the frequency of dosing varies by day. ? ?Your history of seasonal allergies and my physical exam findings are also concerning for exacerbation of your underlying allergies.  It is important that you begin your allergy regiment now and are consistent with taking allergy medications exactly as prescribed.  Allergy medications are preventative and therefore only work well when they are taken daily, not "as needed". ?  ?Please see the list below for recommended medications, dosages and frequencies to provide relief of your current symptoms:   ?  ?Zyrtec (cetirizine): This is an excellent second-generation antihistamine that helps to reduce respiratory inflammatory response to environmental allergens.  In some patients, this medication can cause daytime sleepiness so I recommend that you take 1 tablet daily at bedtime.   ?  ?Flonase (fluticasone): This is a steroid nasal spray that you use once daily, 1 spray in each nare.  This medication does not work well if you decide to use it only used as you feel you need to, it works best used on a daily basis.  After 3 to 5 days of use, you will notice significant reduction of the inflammation and mucus production that is currently being caused by exposure to allergens, whether seasonal or environmental.  The most common side effect of this medication is nosebleeds.  If you experience a nosebleed, please discontinue use for 1 week, then feel free to resume.  I have provided you with a prescription but you can also purchase this medication over-the-counter if your insurance will not cover it. ?  ?Pataday (olopatadine): This is an antihistamine eyedrop that can be used once daily to help relieve dry eyes, itchy eyes and red eyes.  This antihistamine drop not only works for allergic  conjunctivitis but is also very helpful with viral conjunctivitis.  Please do not use this drop more than once a day, for best relief please use this in the morning.   ?  ?If you find that you have not had significant relief of your symptoms in the next 7 to 10 days, please follow-up with your primary care provider or return here to urgent care for repeat evaluation and further recommendations. ?  ?Thank you for visiting urgent care today.  We appreciate the opportunity to participate in your care. ? ?

## 2021-09-23 NOTE — ED Triage Notes (Addendum)
Patient c/o bilateral eye pain that started last night.  ? ?Patient denies eye trauma.  ? ?Patient endorses eye redness and eye drainage.  ? ?Patient endorses pain has worsened. ? ?Patient states pain began in LFT eye and then migrated to RT eye.  ? ?Patient hasn't taken any medications for symptoms.  ?

## 2021-09-25 ENCOUNTER — Other Ambulatory Visit: Payer: Self-pay

## 2021-09-25 ENCOUNTER — Emergency Department (HOSPITAL_COMMUNITY)
Admission: EM | Admit: 2021-09-25 | Discharge: 2021-09-25 | Disposition: A | Discharge status: Home / Self Care | Payer: Medicaid Other | Attending: Emergency Medicine | Admitting: Emergency Medicine

## 2021-09-25 DIAGNOSIS — H1033 Unspecified acute conjunctivitis, bilateral: Secondary | ICD-10-CM

## 2021-09-25 DIAGNOSIS — H53143 Visual discomfort, bilateral: Secondary | ICD-10-CM | POA: Insufficient documentation

## 2021-09-25 MED ORDER — TETRACAINE HCL 0.5 % OP SOLN
1.0000 [drp] | Freq: Once | OPHTHALMIC | Status: AC
Start: 2021-09-25 — End: 2021-09-25
  Administered 2021-09-25: 1 [drp] via OPHTHALMIC
  Filled 2021-09-25: qty 4

## 2021-09-25 MED ORDER — FLUORESCEIN SODIUM 1 MG OP STRP
1.0000 | ORAL_STRIP | Freq: Once | OPHTHALMIC | Status: AC
  Administered 2021-09-25: 1 via OPHTHALMIC
  Filled 2021-09-25: qty 1

## 2021-09-25 NOTE — ED Provider Notes (Signed)
?[facility] EMERGENCY DEPARTMENT ?Provider Note ? ? ?CSN: [id number] ?Arrival date & time: 09/25/21  4235 ? ?  ? ?History ? ?No chief complaint on file. ? ? ?Richard Pennington is a 26 y.o. male. ? ?The history is provided by the patient and medical records. No language interpreter was used.  ? ?26 year old male presenting complaining of eye irritation.  For the past 3 to 4 days patient has had pain to both eyes.  Pain is sharp throbbing with associated redness and drainage come from the eyes.  He endorsed light sensitivity.  Report recent exposure to his girlfriend's daughter who had bacterial conjunctivitis recently.  Patient was also seen at urgent care center 2 days ago for his complaint and subsequently was discharged home with Zyrtec, ciprofloxacin ophthalmic solution as well as Pataday and Flonase.  He returns again with complaints of worsening bilateral eye pain.  He mention the eye discharge did improved.  He does not wear contact lens.  He denies any eye injury.  He denies any joint pain fever or headache. ? ?Home Medications ?Prior to Admission medications   ?Medication Sig Start Date End Date Taking? Authorizing Provider  ?cetirizine (ZYRTEC ALLERGY) 10 MG tablet Take 1 tablet (10 mg total) by mouth at bedtime. 09/23/21 12/22/21  Theadora Rama Scales, PA-C  ?ciprofloxacin (CILOXAN) 0.3 % ophthalmic solution Administer 1 drop in both eyes, every 2 hours, while awake, for 2 days. Then 1 drop, every 4 hours, while awake, for the next 5 days. 09/23/21   Theadora Rama Scales, PA-C  ?fluticasone (FLONASE) 50 MCG/ACT nasal spray Place 1 spray into both nostrils daily. Begin by using 2 sprays in each nare daily for 3 to 5 days, then decrease to 1 spray in each nare daily. 09/23/21   Theadora Rama Scales, PA-C  ?methadone (DOLOPHINE) 10 MG/ML solution Take by mouth every 8 (eight) hours.    [provider]  ?Olopatadine HCl (PATADAY) 0.2 % SOLN Apply 1 drop to eye daily. 09/23/21    Theadora Rama Scales, PA-C  ?   ? ?Allergies    ?Tree extract   ? ?Review of Systems   ?Review of Systems  ?All other systems reviewed and are negative. ? ?Physical Exam ?Updated Vital Signs ?BP 118/72 (BP Location: Left Arm)   Pulse 70   Temp 97.6 ?F (36.4 ?C) (Oral)   Resp 18   SpO2 99%  ?Physical Exam ?Vitals and nursing note reviewed.  ?Constitutional:   ?   General: He is not in acute distress. ?   Appearance: He is well-developed.  ?HENT:  ?   Head: Atraumatic.  ?Eyes:  ?   General: Lids are normal. Lids are everted, no foreign bodies appreciated. Vision grossly intact. Gaze aligned appropriately.     ?   Right eye: No foreign body or discharge.     ?   Left eye: No foreign body or discharge.  ?   Intraocular pressure: Right eye pressure is 18 mmHg. Left eye pressure is 18 mmHg. Measurements were taken using a handheld tonometer. ?   Extraocular Movements: Extraocular movements intact.  ?   Conjunctiva/sclera:  ?   Right eye: Right conjunctiva is injected. Hemorrhage present. No chemosis or exudate. ?   Left eye: Left conjunctiva is injected. Hemorrhage present. No chemosis or exudate. ?   Pupils: Pupils are equal, round, and reactive to light.  ?   Right eye: Pupil is round, reactive and not sluggish. No corneal abrasion or fluorescein  uptake. Seidel exam negative.  ?   Left eye: Pupil is round, reactive and not sluggish. No corneal abrasion or fluorescein uptake. Seidel exam negative. ?   Slit lamp exam: ?   Right eye: Anterior chamber quiet. Photophobia present. No corneal flare, corneal ulcer, foreign body, hyphema, anterior chamber bulge or anterior chamber flares.  ?   Left eye: Anterior chamber quiet. Photophobia present. No corneal flare, corneal ulcer, foreign body, hyphema, hypopyon, anterior chamber bulge or anterior chamber flares.  ?Musculoskeletal:  ?   Cervical back: Neck supple.  ?Skin: ?   Findings: No rash.  ?Neurological:  ?   Mental Status: He is alert.  ? ? ?ED Results / Procedures /  Treatments   ?Labs ?(all labs ordered are listed, but only abnormal results are displayed) ?Labs Reviewed - No data to display ? ?EKG ?None ? ?Radiology ?No results found. ? ?Procedures ?Procedures  ? ? ?Medications Ordered in ED ?Medications  ?fluorescein ophthalmic strip 1 strip (has no administration in time range)  ?tetracaine (PONTOCAINE) 0.5 % ophthalmic solution 1 drop (1 drop Both Eyes Given 09/25/21 0609)  ? ? ?ED Course/ Medical Decision Making/ A&P ?  ?                        ?Medical Decision Making ? ?BP 118/72 (BP Location: Left Arm)   Pulse 70   Temp 97.6 ?F (36.4 ?C) (Oral)   Resp 18   SpO2 99%  ? ?46:18 PM ?26 year old male presenting complaining of bilateral eye pain and eye redness ongoing for the past 2 to 3 days.  Initially seen in urgent care center for this complaint and was prescribed antibiotic eyedrops as well as antihistamine.  He took the medication but reported his eye pain is becoming progressively worse.  He denies any vision changes but does endorse photophobia.  On exam he has injected conjunctival bilaterally with small subconjunctival hemorrhages.   ? ?On exam, he has normal intraocular pressure of 18 bilaterally.  Vision is 20/20.  No fluorescein stain uptake.  However he exhibit exquisite tenderness with light sensitivity ? ?Differential diagnosis including viral conjunctivitis, bacterial conjunctivitis, uveitis, keratitis, acute angle glaucoma, Reiter syndrome. ? ?Appreciate consultation from on-call ophthalmologist, Dr. Nada Libman, who request patient to be seen in his office today for further management.  Patient made aware of plan and agrees with plan.  Patient will follow-up in the office as soon as he gets discharged from here. ? ?Patient was given tetracaine eyedrops which she reported mild improvement of his symptoms but still endorse quite a bit of eye discomfort. ? ? ? ? ? ? ? ?Final Clinical Impression(s) / ED Diagnoses ?Final diagnoses:  ?Acute conjunctivitis of both  eyes, unspecified acute conjunctivitis type  ? ? ?Rx / DC Orders ?ED Discharge Orders   ? ? None  ? ?  ? ? ?  ?Fayrene Helper, PA-C ?09/25/21 1456 ? ?  ?Franne Forts, DO ?09/29/21 1545 ? ?

## 2021-09-25 NOTE — Discharge Instructions (Signed)
Please go straight to ophthalmologist office when you leave here for further care ?

## 2021-09-25 NOTE — ED Triage Notes (Signed)
Pt dx with bilateral pink eye and now having severe pain to both eyes.  ?

## 2021-09-25 NOTE — ED Provider Triage Note (Signed)
?  Emergency Medicine Provider Triage Evaluation Note ? ?MRN:  [id number]  ?Arrival date & time: 09/25/21    ?Medically screening exam initiated at 5:49 AM.   ?CC:   ?No chief complaint on file. ?  ?HPI:  ?Richard Pennington is a 26 y.o. year-old male presents to the ED with chief complaint of bilateral eye pain.  Dx'd with pink eye 2 days ago and taking cipro ophthalmic but reports worsening pain and photophobia. ? ?History provided by  ?ROS:  ?-As included in HPI ?PE:  ? ?Vitals:  ? 09/25/21 0546 09/25/21 0546  ?BP: (!) 140/104   ?Pulse: 92 87  ?Resp: 17   ?Temp:  97.9 ?F (36.6 ?C)  ?SpO2: 100% 99%  ?  ?Uncomfortable appearing ?No respiratory distress ?Bilateral conjunctival erythema ?MDM:  ?Based on signs and symptoms, conjunctivitis, iritis, uveitis is highest on my differential. ?I've ordered tetracaine for comfort in triage to expedite lab/diagnostic workup. ? ?Patient was informed that the remainder of the evaluation will be completed by another provider, this initial triage assessment does not replace that evaluation, and the importance of remaining in the ED until their evaluation is complete. ? ?  ?Roxy Horseman, PA-C ?09/25/21 0551 ? ?

## 2021-10-03 ENCOUNTER — Other Ambulatory Visit: Payer: Self-pay

## 2021-10-03 ENCOUNTER — Ambulatory Visit (INDEPENDENT_AMBULATORY_CARE_PROVIDER_SITE_OTHER): Payer: Self-pay | Admitting: Internal Medicine

## 2021-10-03 ENCOUNTER — Encounter: Payer: Self-pay | Admitting: Internal Medicine

## 2021-10-03 VITALS — BP 133/91 | HR 80 | Temp 98.1°F | Wt 186.0 lb

## 2021-10-03 DIAGNOSIS — B182 Chronic viral hepatitis C: Secondary | ICD-10-CM

## 2021-10-03 NOTE — Progress Notes (Signed)
? ?   ? ? ? ?Patient's Medications  ?New Prescriptions  ? No medications on file  ?Previous Medications  ? CETIRIZINE (ZYRTEC ALLERGY) 10 MG TABLET    Take 1 tablet (10 mg total) by mouth at bedtime.  ? CIPROFLOXACIN (CILOXAN) 0.3 % OPHTHALMIC SOLUTION    Administer 1 drop in both eyes, every 2 hours, while awake, for 2 days. Then 1 drop, every 4 hours, while awake, for the next 5 days.  ? FLUTICASONE (FLONASE) 50 MCG/ACT NASAL SPRAY    Place 1 spray into both nostrils daily. Begin by using 2 sprays in each nare daily for 3 to 5 days, then decrease to 1 spray in each nare daily.  ? METHADONE (DOLOPHINE) 10 MG/ML SOLUTION    Take 55 mg by mouth 2 (two) times daily.  ? NAPROXEN SODIUM (ALEVE) 220 MG TABLET    Take 220 mg by mouth 2 (two) times daily as needed (pain).  ? OLOPATADINE HCL (PATADAY) 0.2 % SOLN    Apply 1 drop to eye daily.  ?Modified Medications  ? No medications on file  ?Discontinued Medications  ? No medications on file  ? ? 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 He completed 8 weeks of Mavyret on 06/23/21. Reports 100% adherence.  ?  ?ROS: Denies yellowish discoloration of sclera and skin, abdominal pain/distension, hematemesis.  ?Denis cough, fever, chills, nightsweats, nausea, vomiting, diarrhea, constipation, weight loss, recent hospitalizations, rashes, joint complaints, shortness of breath, chest pain, headaches, dysuria . ? ?Review of Systems: ?Review of Systems  ?All other systems reviewed and are negative. ? ?Past Medical History:  ?Diagnosis Date  ? Hepatitis C   ? ? ?Social History  ? ?Tobacco Use  ? Smoking status: Every Day  ?  Packs/day: 0.10  ?  Types: Cigarettes  ? Smokeless tobacco: Never  ? Tobacco comments:  ?  2 cigarettes a day  ?Vaping Use  ? Vaping Use: Every day  ? Substances: Nicotine, Flavoring  ?Substance Use Topics  ? Alcohol use: Not Currently  ? Drug use: Not Currently  ?  Comment: heroin  ? ? ?No  family history on file. ? ?Allergies  ?Allergen Reactions  ? Other Anaphylaxis  ?  Tree Nuts  ? Tree Extract Anaphylaxis  ? ? ?Health Maintenance  ?Topic Date Due  ? COVID-19 Vaccine (1) Never done  ? HPV VACCINES (1 - Male 2-dose series) Never done  ? TETANUS/TDAP  Never done  ? INFLUENZA VACCINE  01/13/2022  ? Hepatitis C Screening  Completed  ? HIV Screening  Completed  ? ? ?Objective: ? ?Vitals:  ? 10/03/21 1351  ?Weight: 186 lb (84.4 kg)  ? ?Body mass index is 24.54 kg/m?. ? ?Physical Exam ?Constitutional:   ?   General: He is not in acute distress. ?   Appearance: He is normal weight. He is not toxic-appearing.  ?HENT:  ?   Head: Normocephalic and atraumatic.  ?   Right Ear: External ear normal.  ?   Left Ear: External ear normal.  ?   Nose: No congestion or rhinorrhea.  ?   Mouth/Throat:  ?   Mouth: Mucous membranes are moist.  ?   Pharynx: Oropharynx is clear.  ?Eyes:  ?   Extraocular Movements: Extraocular movements intact.  ?   Conjunctiva/sclera: Conjunctivae normal.  ?   Pupils: Pupils are equal, round, and reactive to light.  ?Cardiovascular:  ?  Rate and Rhythm: Normal rate and regular rhythm.  ?   Heart sounds: No murmur heard. ?  No friction rub. No gallop.  ?Pulmonary:  ?   Effort: Pulmonary effort is normal.  ?   Breath sounds: Normal breath sounds.  ?Abdominal:  ?   General: Abdomen is flat. Bowel sounds are normal.  ?   Palpations: Abdomen is soft.  ?Musculoskeletal:     ?   General: No swelling. Normal range of motion.  ?   Cervical back: Normal range of motion and neck supple.  ?Skin: ?   General: Skin is warm and dry.  ?Neurological:  ?   General: No focal deficit present.  ?   Mental Status: He is oriented to person, place, and time.  ?Psychiatric:     ?   Mood and Affect: Mood normal.  ? ? ?Lab Results ?Lab Results  ?Component Value Date  ? WBC 6.3 07/07/2021  ? HGB 14.6 07/07/2021  ? HCT 42.4 07/07/2021  ? MCV 89.3 07/07/2021  ? PLT 249 07/07/2021  ?  ?Lab Results  ?Component Value Date  ?  CREATININE 0.95 07/07/2021  ? BUN 16 07/07/2021  ? NA 138 07/07/2021  ? K 4.5 07/07/2021  ? CL 101 07/07/2021  ? CO2 32 07/07/2021  ?  ?Lab Results  ?Component Value Date  ? ALT 11 07/07/2021  ? AST 17 07/07/2021  ? GGT 41 03/26/2021  ? ALKPHOS 157 (H) 12/04/2019  ? BILITOT 0.5 07/07/2021  ?  ?No results found for: CHOL, HDL, LDLCALC, LDLDIRECT, TRIG, CHOLHDL ?No results found for: LABRPR, RPRTITER ?No results found for: HIV1RNAQUANT, HIV1RNAVL, CD4TABS ?  ?Assessment/Plan: ?#Hepatitis C-Chronic SP mavyret x 8 weeks ?-HCV VL <15 on 05/23/22(4 weeks). Completed 8 weeks on 06/24/21 ?Prior treatment: none ?GT: 3 ?Evidence of cirrhosis: none. U/S on 04/08/21: possible fatty infiltration, no focal liver lesions ?Fibrosis stage: Fo-F1 no fibrosis ?Plan: ?-HAV 2nd dose due 4/26 or after ?-Labs today: cbc, cmp, hcv rna for SVR ?-Follow-up 6 months given high risk of possible re-infection ?  ?#Hx of IVDA ?-Pt last used IV drugs heroin over  2 years ago. Has been on methadone x 2 + years and lived at [place] [facility](sober living).  ?  ?  ?  ?Counseling done on the following ?-Natural progression of hep c, transmission (avoid sharing personal hygiene equipment), prevention, risks of left untreated and treatment options  ?-Avoid hepatotoxins like alcohol and excessive acetamaminphen (no more than 2 gram a day) ?-Avoid eating raw sea food ?-Risks of re-infection  ?-Hepatitis coinfection and vaccination ? ?Danelle Earthly, MD ?[facility] ?[facility] ?10/03/2021, 1:52 PM  ?

## 2021-10-07 LAB — CBC WITH DIFFERENTIAL/PLATELET
Absolute Monocytes: 548 cells/uL (ref 200–950)
Basophils Absolute: 19 cells/uL (ref 0–200)
Basophils Relative: 0.3 %
Eosinophils Absolute: 32 cells/uL (ref 15–500)
Eosinophils Relative: 0.5 %
HCT: 40.4 % (ref 38.5–50.0)
Hemoglobin: 13.5 g/dL (ref 13.2–17.1)
Lymphs Abs: 1789 cells/uL (ref 850–3900)
MCH: 29.9 pg (ref 27.0–33.0)
MCHC: 33.4 g/dL (ref 32.0–36.0)
MCV: 89.6 fL (ref 80.0–100.0)
MPV: 9.9 fL (ref 7.5–12.5)
Monocytes Relative: 8.7 %
Neutro Abs: 3912 cells/uL (ref 1500–7800)
Neutrophils Relative %: 62.1 %
Platelets: 222 10*3/uL (ref 140–400)
RBC: 4.51 10*6/uL (ref 4.20–5.80)
RDW: 12.8 % (ref 11.0–15.0)
Total Lymphocyte: 28.4 %
WBC: 6.3 10*3/uL (ref 3.8–10.8)

## 2021-10-07 LAB — COMPLETE METABOLIC PANEL WITH GFR
AG Ratio: 1.8 (calc) (ref 1.0–2.5)
ALT: 10 U/L (ref 9–46)
AST: 16 U/L (ref 10–40)
Albumin: 4.6 g/dL (ref 3.6–5.1)
Alkaline phosphatase (APISO): 97 U/L (ref 36–130)
BUN: 16 mg/dL (ref 7–25)
CO2: 29 mmol/L (ref 20–32)
Calcium: 9.6 mg/dL (ref 8.6–10.3)
Chloride: 103 mmol/L (ref 98–110)
Creat: 0.89 mg/dL (ref 0.60–1.24)
Globulin: 2.6 g/dL (calc) (ref 1.9–3.7)
Glucose, Bld: 77 mg/dL (ref 65–99)
Potassium: 3.9 mmol/L (ref 3.5–5.3)
Sodium: 139 mmol/L (ref 135–146)
Total Bilirubin: 0.6 mg/dL (ref 0.2–1.2)
Total Protein: 7.2 g/dL (ref 6.1–8.1)
eGFR: 123 mL/min/{1.73_m2} (ref >=60)

## 2021-10-07 LAB — HEPATITIS C RNA QUANTITATIVE
HCV Quantitative Log: <1.18 log IU/mL
HCV RNA, PCR, QN: <15 IU/mL

## 2021-12-22 IMAGING — CT CT ABD-PELV W/ CM
2 of 4 series · 17 of 46 positions shown, 19 images · IV contrast (OMNIPAQUE 300)
Comparison: None.

CLINICAL DATA: Left lower quadrant abdominal pain.

EXAM:
CT ABDOMEN AND PELVIS WITH CONTRAST
TECHNIQUE: Multidetector CT imaging of the abdomen and pelvis was performed
using the standard protocol following bolus administration of
intravenous contrast.
CONTRAST:  100mL OMNIPAQUE IOHEXOL 300 MG/ML  SOLN

[Series 2: axial st · axial · 0.77mm/px · z∈[+1132,+1592]mm · 14 of 104 slices shown, 16 images]
[im 6/104  soft-tissue]
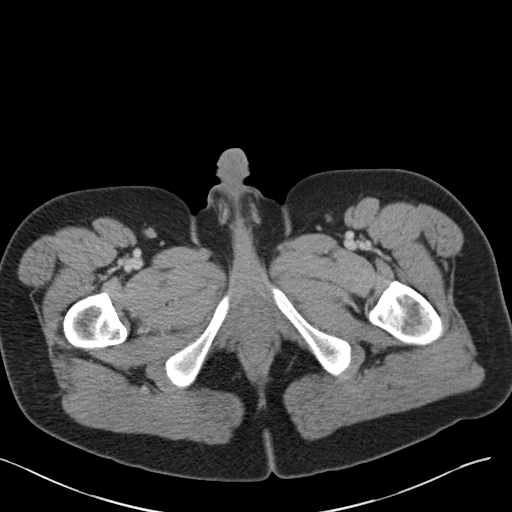
[im 6/104  bone]
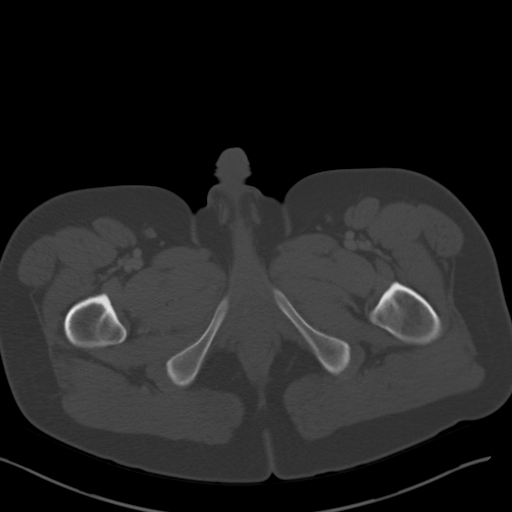
[im 12/104  soft-tissue]
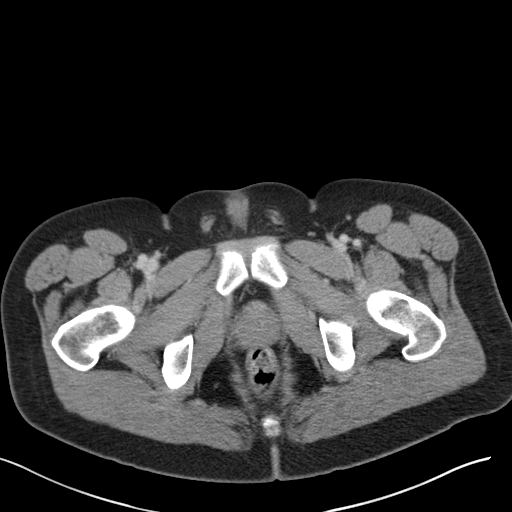
[im 23/104  soft-tissue]
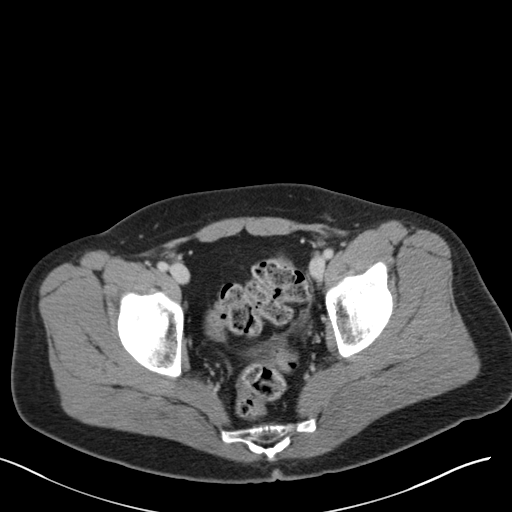
[im 29/104  soft-tissue]
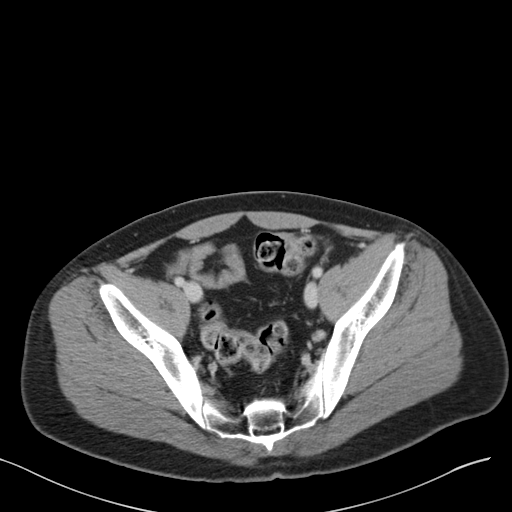
[im 35/104  soft-tissue]
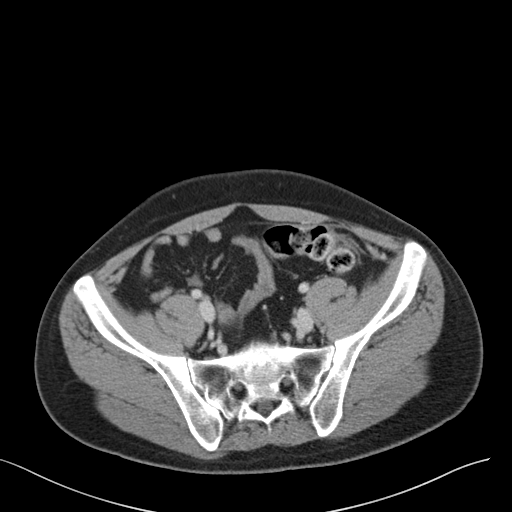
[im 41/104  soft-tissue]
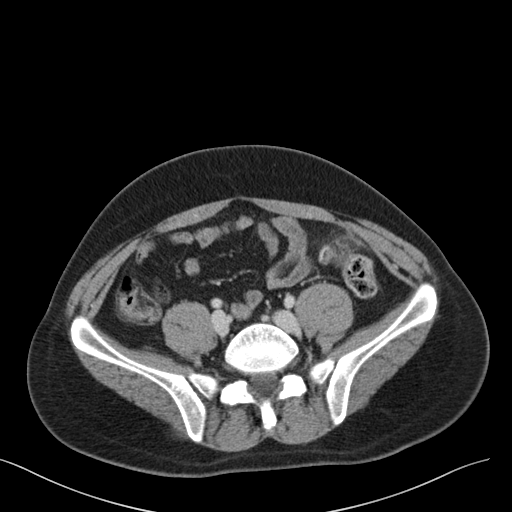
[im 46/104  soft-tissue]
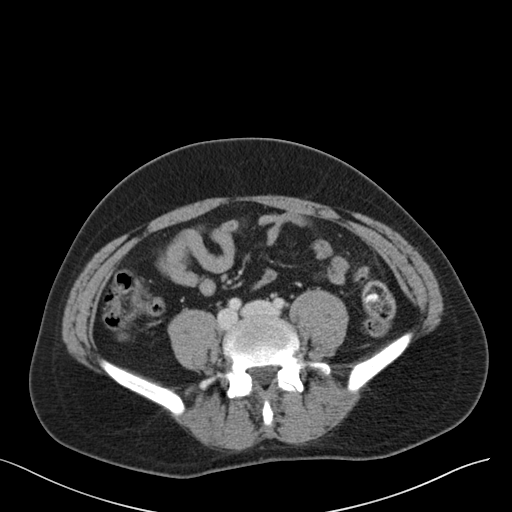
[im 58/104  soft-tissue]
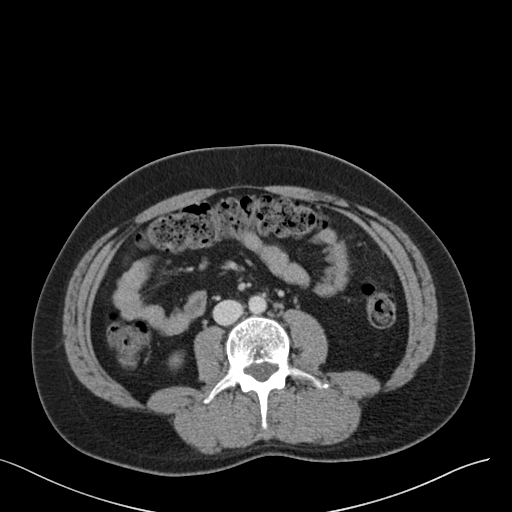
[im 63/104  soft-tissue]
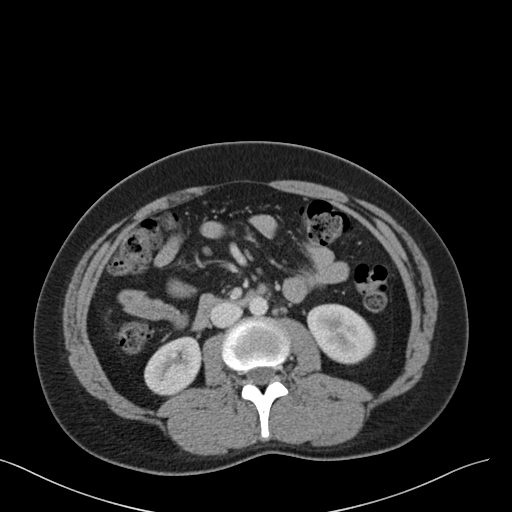
[im 63/104  bone]
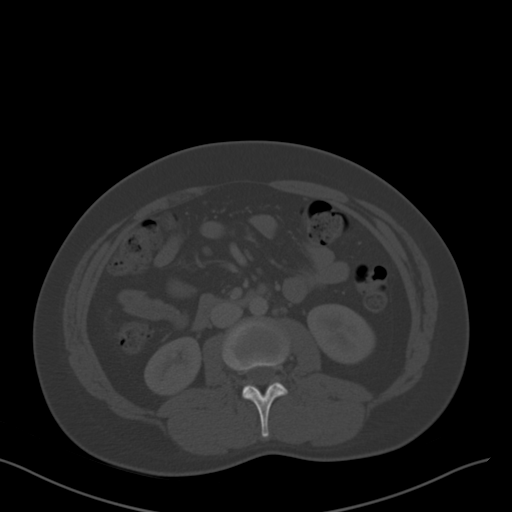
[im 69/104  soft-tissue]
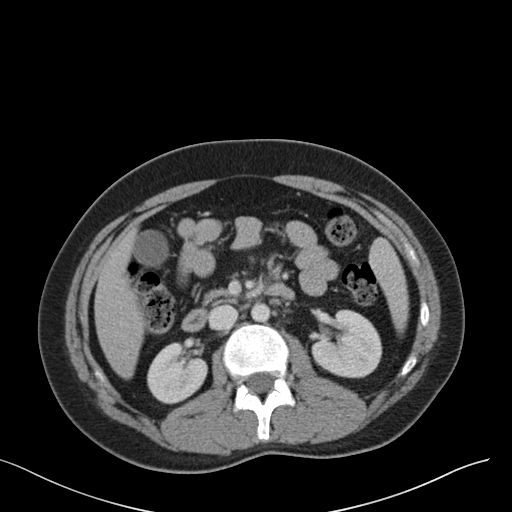
[im 75/104  soft-tissue]
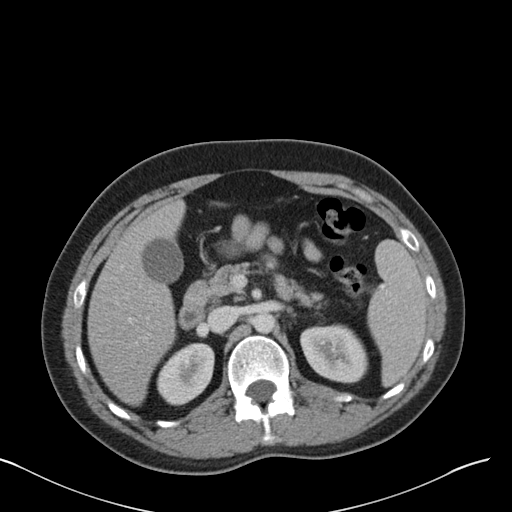
[im 81/104  soft-tissue]
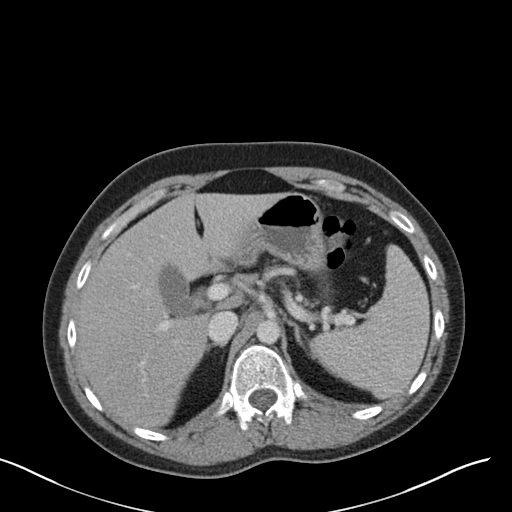
[im 92/104  soft-tissue]
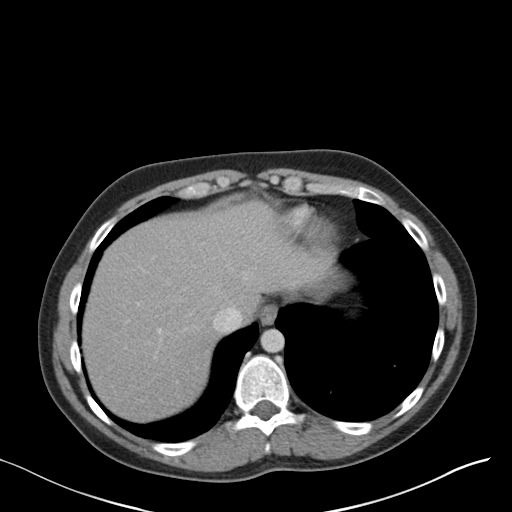
[im 98/104  soft-tissue]
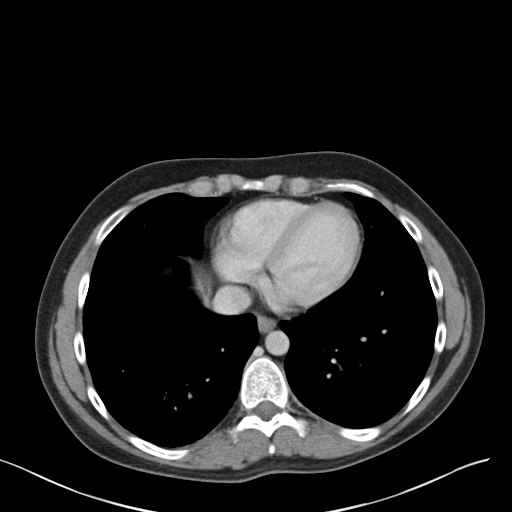

[Series 4: coronal st · coronal · 0.96mm/px · 3 of 116 slices shown]
[im 39/116  soft-tissue]
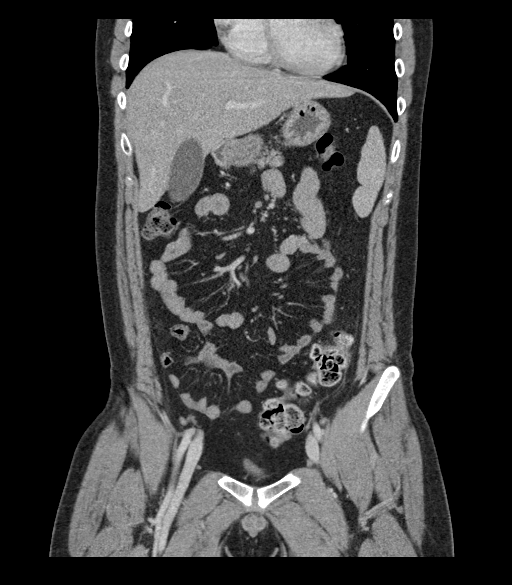
[im 52/116  soft-tissue]
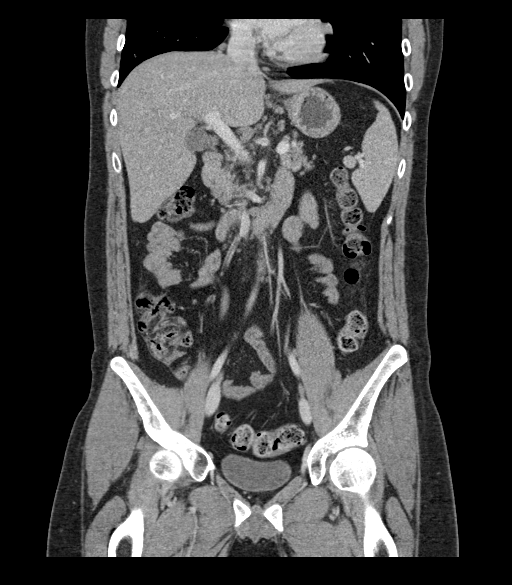
[im 64/116  soft-tissue]
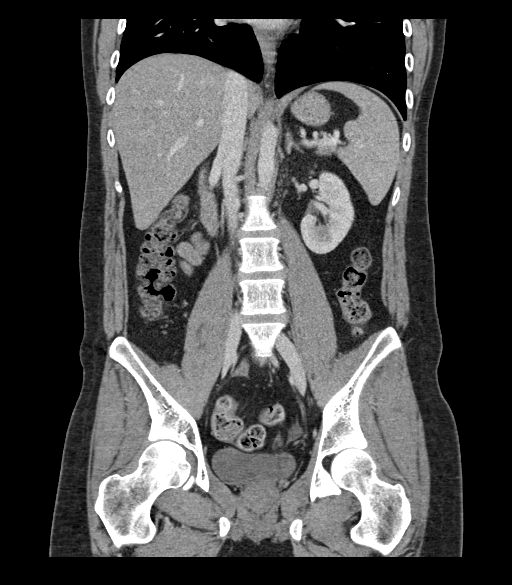

[17 of 46 positions shown; findings below may reference images not displayed]

FINDINGS: Lower chest: No acute abnormality.

Hepatobiliary: No focal liver abnormality is seen. No gallstones,
gallbladder wall thickening, or biliary dilatation.

Pancreas: Unremarkable. No pancreatic ductal dilatation or
surrounding inflammatory changes.

Spleen: Normal in size without focal abnormality.

Adrenals/Urinary Tract: Adrenal glands are unremarkable. Kidneys are
normal, without renal calculi, focal lesion, or hydronephrosis.
Bladder is unremarkable.

Stomach/Bowel: Stomach is within normal limits. History of prior
appendectomy. No evidence of bowel wall thickening, distention, or
inflammatory changes.

Vascular/Lymphatic: No significant vascular findings are present. No
enlarged abdominal or pelvic lymph nodes.

Reproductive: Prostate is unremarkable.

Other: Focal fat stranding surrounding an epiploic appendage arising
from the descending/sigmoid colon junction (series 2, image 65;
series 4, image 32). Trace free fluid in the pelvis. No
pneumoperitoneum.

Musculoskeletal: No acute or significant osseous findings.
IMPRESSION: 1. Acute epiploic appendagitis at the descending/sigmoid colon
junction.

## 2022-03-05 ENCOUNTER — Emergency Department (HOSPITAL_COMMUNITY)
Admission: EM | Admit: 2022-03-05 | Discharge: 2022-03-05 | Disposition: A | Discharge status: Home / Self Care | Payer: PRIVATE HEALTH INSURANCE | Attending: Emergency Medicine | Admitting: Emergency Medicine

## 2022-03-05 ENCOUNTER — Encounter (HOSPITAL_COMMUNITY): Payer: Self-pay

## 2022-03-05 ENCOUNTER — Emergency Department (HOSPITAL_COMMUNITY): Payer: PRIVATE HEALTH INSURANCE

## 2022-03-05 ENCOUNTER — Other Ambulatory Visit: Payer: Self-pay

## 2022-03-05 DIAGNOSIS — S61012A Laceration without foreign body of left thumb without damage to nail, initial encounter: Secondary | ICD-10-CM

## 2022-03-05 DIAGNOSIS — S61215A Laceration without foreign body of left ring finger without damage to nail, initial encounter: Secondary | ICD-10-CM

## 2022-03-05 DIAGNOSIS — Y990 Civilian activity done for income or pay: Secondary | ICD-10-CM | POA: Insufficient documentation

## 2022-03-05 DIAGNOSIS — S61213A Laceration without foreign body of left middle finger without damage to nail, initial encounter: Secondary | ICD-10-CM | POA: Diagnosis not present

## 2022-03-05 DIAGNOSIS — W268XXA Contact with other sharp object(s), not elsewhere classified, initial encounter: Secondary | ICD-10-CM | POA: Diagnosis not present

## 2022-03-05 DIAGNOSIS — S6992XA Unspecified injury of left wrist, hand and finger(s), initial encounter: Secondary | ICD-10-CM | POA: Diagnosis present

## 2022-03-05 MED ORDER — KETOROLAC TROMETHAMINE 60 MG/2ML IM SOLN
30.0000 mg | Freq: Once | INTRAMUSCULAR | Status: AC
  Administered 2022-03-05: 30 mg via INTRAMUSCULAR
  Filled 2022-03-05: qty 2

## 2022-03-05 MED ORDER — LIDOCAINE HCL (PF) 1 % IJ SOLN
30.0000 mL | Freq: Once | INTRAMUSCULAR | Status: AC
  Administered 2022-03-05: 30 mL
  Filled 2022-03-05: qty 30

## 2022-03-05 MED ORDER — CEPHALEXIN 500 MG PO CAPS: 500.0000 mg | ORAL_CAPSULE | Freq: Four times a day (QID) | ORAL | 0 refills | Status: AC

## 2022-03-05 NOTE — ED Provider Notes (Signed)
[facility]-EMERGENCY DEPT Provider Note   CSN: [id number] Arrival date & time: 03/05/22  1101     History  Chief Complaint  Patient presents with   Laceration    Richard Pennington is a 26 y.o. male.  Presents ED for evaluation of lacerations to the left first third and fourth digits.  Occurred just prior to arrival.  Was using a table saw when his hand got in the way of the blade.  Bleeding initially controlled with compression.  Distal pad of the fingers affected.  Last tetanus vaccine was 4 years ago.  Denies numbness, weakness or tingling in the affected extremities.    Laceration      Home Medications Prior to Admission medications   Medication Sig Start Date End Date Taking? Authorizing Provider  cetirizine (ZYRTEC ALLERGY) 10 MG tablet Take 1 tablet (10 mg total) by mouth at bedtime. 09/23/21 12/22/21  Theadora Rama Scales, PA-C  ciprofloxacin (CILOXAN) 0.3 % ophthalmic solution Administer 1 drop in both eyes, every 2 hours, while awake, for 2 days. Then 1 drop, every 4 hours, while awake, for the next 5 days. 09/23/21   Theadora Rama Scales, PA-C  fluticasone (FLONASE) 50 MCG/ACT nasal spray Place 1 spray into both nostrils daily. Begin by using 2 sprays in each nare daily for 3 to 5 days, then decrease to 1 spray in each nare daily. 09/23/21   Theadora Rama Scales, PA-C  methadone (DOLOPHINE) 10 MG/ML solution Take 55 mg by mouth 2 (two) times daily.    [provider]  naproxen sodium (ALEVE) 220 MG tablet Take 220 mg by mouth 2 (two) times daily as needed (pain).    [provider]  Olopatadine HCl (PATADAY) 0.2 % SOLN Apply 1 drop to eye daily. 09/23/21   Theadora Rama Scales, PA-C      Allergies    Other and Tree extract    Review of Systems   Review of Systems  Skin:  Positive for wound.  All other systems reviewed and are negative.   Physical Exam Updated Vital Signs BP (!) 136/95 (BP Location: Right Arm)   Pulse  (!) 101   Resp (!) 22   SpO2 99%  Physical Exam Vitals and nursing note reviewed.  Constitutional:      General: He is not in acute distress.    Appearance: Normal appearance. He is normal weight. He is not ill-appearing.  HENT:     Head: Normocephalic and atraumatic.  Pulmonary:     Effort: Pulmonary effort is normal. No respiratory distress.  Abdominal:     General: Abdomen is flat.  Musculoskeletal:        General: Normal range of motion.     Right hand: Normal.     Left hand: Laceration (Pad of the left first third and fourth fingers sparing the nail) present. Normal strength. Normal sensation. Normal capillary refill. Normal pulse.     Cervical back: Neck supple.     Comments: Sensation, movement, strength intact.  Cap refill normal.  Radial pulses 2+ bilaterally.  Skin:    General: Skin is warm and dry.  Neurological:     Mental Status: He is alert and oriented to person, place, and time.  Psychiatric:        Mood and Affect: Mood normal.        Behavior: Behavior normal.      ED Results / Procedures / Treatments   Labs (all labs ordered are listed, but only  abnormal results are displayed) Labs Reviewed - No data to display  EKG None  Radiology No results found.  Procedures .Marland KitchenLaceration Repair  Date/Time: 03/05/2022 1:41 PM  Performed by: Michelle Piper, PA-C Authorized by: Michelle Piper, PA-C   Consent:    Consent obtained:  Verbal   Consent given by:  Patient   Risks discussed:  Infection, need for additional repair, pain, poor cosmetic result and poor wound healing   Alternatives discussed:  No treatment Universal protocol:    Procedure explained and questions answered to patient or proxy's satisfaction: yes     Relevant documents present and verified: yes     Test results available: yes     Imaging studies available: yes     Required blood products, implants, devices, and special equipment available: yes     Site/side marked: yes      Immediately prior to procedure, a time out was called: yes     Patient identity confirmed:  Verbally with patient Anesthesia:    Anesthesia method:  Nerve block   Block location:  Left first third and fourth digits   Block needle gauge:  27 G   Block anesthetic:  Lidocaine 1% w/o epi   Block technique:  Ring block   Block injection procedure:  Anatomic landmarks identified, introduced needle, incremental injection, anatomic landmarks palpated and negative aspiration for blood   Block outcome:  Anesthesia achieved Laceration details:    Location:  Finger   Finger location:  L long finger Pre-procedure details:    Preparation:  Patient was prepped and draped in usual sterile fashion and imaging obtained to evaluate for foreign bodies Exploration:    Hemostasis achieved with:  Direct pressure   Imaging obtained: x-ray     Imaging outcome: foreign body not noted     Wound exploration: wound explored through full range of motion and entire depth of wound visualized   Treatment:    Area cleansed with:  Soap and water   Amount of cleaning:  Extensive   Irrigation solution:  Tap water   Irrigation method:  Tap Skin repair:    Repair method:  Sutures   Suture size:  5-0   Suture material:  Nylon   Suture technique:  Simple interrupted   Number of sutures:  8 Approximation:    Approximation:  Loose Repair type:    Repair type:  Simple Post-procedure details:    Dressing:  Antibiotic ointment and non-adherent dressing   Procedure completion:  Tolerated Comments:     Skin mostly avulsed on the first and fourth fingers     Medications Ordered in ED Medications  ketorolac (TORADOL) injection 30 mg (has no administration in time range)  lidocaine (PF) (XYLOCAINE) 1 % injection 30 mL (has no administration in time range)    ED Course/ Medical Decision Making/ A&P Clinical Course as of 03/05/22 1234  Thu Mar 05, 2022  1233 DG Hand Complete Left I personally reviewed the image.  No  osseous abnormality or foreign body [AS]    Clinical Course User Index [AS] Richard Pennington, Edsel Petrin, PA-C                           Medical Decision Making Amount and/or Complexity of Data Reviewed Radiology: ordered. Decision-making details documented in ED Course.  Risk Prescription drug management.  This patient presents to the ED for concern of lacerations to the left hand including the first, third and  fourth fingers   My initial workup includes x ray to rule out fracture or foreign bodies  Additional history obtained from: Nursing notes from this visit.   I ordered imaging studies including x-ray left hand I independently visualized and interpreted imaging which showed no foreign bodies or osseous abnormalities I agree with the radiologist interpretation   Afebrile, hemodynamically stable.  Patient is a 26 year old male who presents ED for evaluation of avulsions to the pad of the left first third and fourth digits.  This occurred just prior to arrival.  He was using a table saw and got his fingers caught in the blade.  Hemostasis was initially achieved by direct pressure.  Patient states that his last tetanus shot was 4 years ago.  Neurovascular status intact including sensation, strength, range of motion, capillary refill, radial pulses.  Most of the of affected areas on the first and fourth digits had complete avulsion of skin, however I did attempt to close the skin that was left.  Third finger with simple lacerations that were repaired successfully.  I did thoroughly irrigate and cleanse the wound prior to closure.  Applied topical antibiotic and Xeroform.  Gave patient referral to hand surgery and instructed him to call for an appointment today.  Also gave patient prescription for Keflex.  Informed patient of warning signs for return.  Stable at discharge.   At this time there does not appear to be any evidence of an acute emergency medical condition and the patient appears  stable for discharge with appropriate outpatient follow up. Diagnosis was discussed with patient who verbalizes understanding of care plan and is agreeable to discharge. I have discussed return precautions with patient and mother who verbalizes understanding. Patient encouraged to follow-up with their PCP within 1 week. All questions answered.  Patient's case discussed with Dr. Truett Mainland who agrees with plan to discharge with follow-up.   Note: Portions of this report may have been transcribed using voice recognition software. Every effort was made to ensure accuracy; however, inadvertent computerized transcription errors may still be present.          Final Clinical Impression(s) / ED Diagnoses Final diagnoses:  None    Rx / DC Orders ED Discharge Orders     None         Roylene Reason, Hershal Coria 03/05/22 1415    Cristie Hem, MD 03/05/22 1549

## 2022-03-05 NOTE — Discharge Instructions (Addendum)
You have been seen today for your complaint of lacerations to your left hand. Your imaging showed no foreign bodies or broken bones. Your discharge medications include keflex. This is an antibiotic. You should take it as prescribed and for the entire duration of the prescription. Home care instructions are as follows:  You may briefly (15 seconds) soak the wounds in warm soapy water after 48 hours. You may alternate tylenol and ibuprofen every 4 hours at home. You may take up to 800 mg of ibuprofen and up to 1000 mg of tylenol at a time. Follow up with: Hand surgery Dr. Ala Bent. You should call to schedule an appointment today.  Please seek immediate medical care if you develop any of the following symptoms: You develop severe swelling around the wound. Your pain suddenly increases and is severe. You develop painful lumps near the wound or on skin anywhere else on your body. You have a red streak going away from your wound. The wound is on your hand or foot, and you cannot properly move a finger or toe. The wound is on your hand or foot, and you notice that your fingers or toes look pale or bluish. At this time there does not appear to be the presence of an emergent medical condition, however there is always the potential for conditions to change. Please read and follow the below instructions.  Do not take your medicine if  develop an itchy rash, swelling in your mouth or lips, or difficulty breathing; call 911 and seek immediate emergency medical attention if this occurs.  You may review your lab tests and imaging results in their entirety on your MyChart account.  Please discuss all results of fully with your primary care provider and other specialist at your follow-up visit.  Note: Portions of this text may have been transcribed using voice recognition software. Every effort was made to ensure accuracy; however, inadvertent computerized transcription errors may still be present.

## 2022-03-05 NOTE — ED Triage Notes (Signed)
Pt arrived POV from work. Pt cut his 1st,3rd,4th digits with a saw.

## 2022-04-02 ENCOUNTER — Telehealth: Payer: Self-pay

## 2022-04-02 ENCOUNTER — Ambulatory Visit: Payer: Medicaid Other | Admitting: Internal Medicine

## 2022-04-02 NOTE — Telephone Encounter (Signed)
Attempted to call patient regarding missed appointment. Not able to reach her at this time. Left voicemail requesting call back to reschedule. Leatrice Jewels, RMA

## 2022-04-03 ENCOUNTER — Ambulatory Visit: Payer: Medicaid Other | Admitting: Internal Medicine

## 2023-04-27 IMAGING — US US ABDOMEN COMPLETE
1 series · 14 of 25 positions shown · non-contrast
Comparison: CT December 04, 2019.

CLINICAL DATA: Chronic hepatitis C.

EXAM:
ABDOMEN ULTRASOUND COMPLETE

[Series 1: us abdomen complete · 14 of 117 slices shown]
[im 1/117]
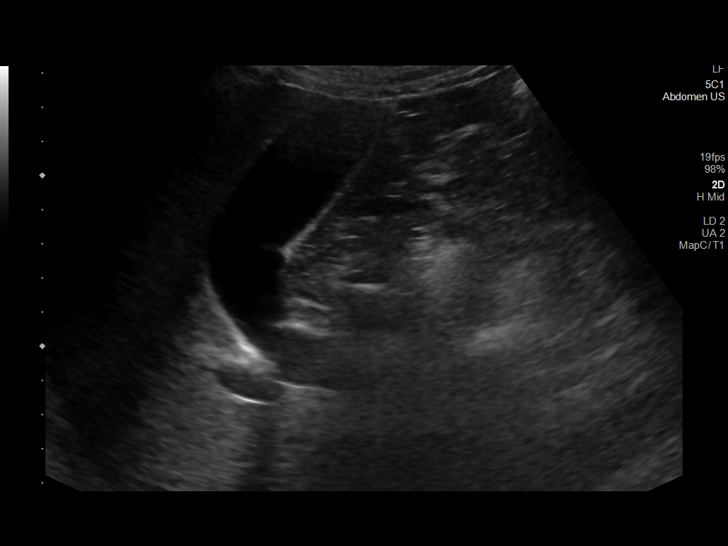
[im 10/117]
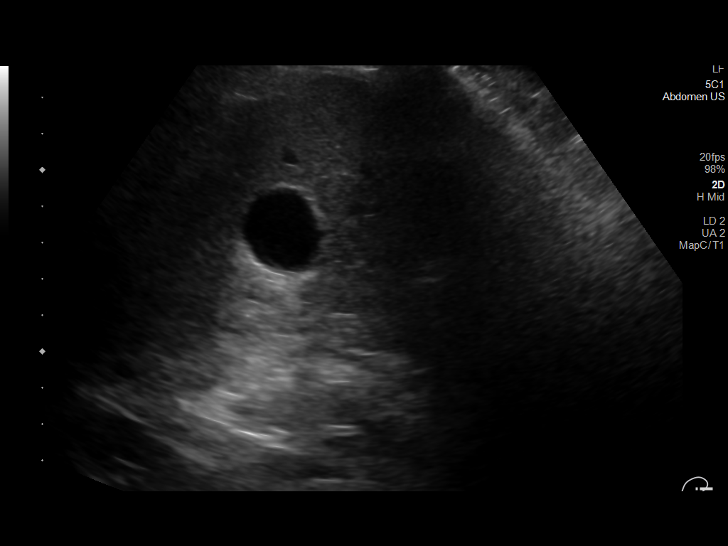
[im 20/117]
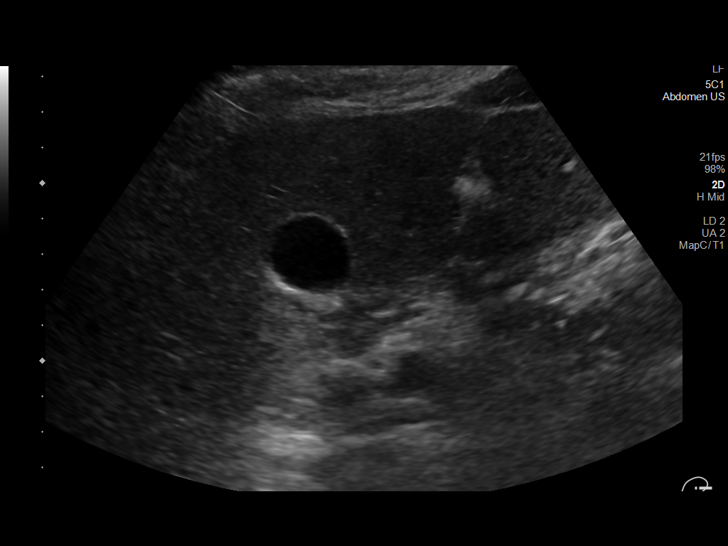
[im 30/117]
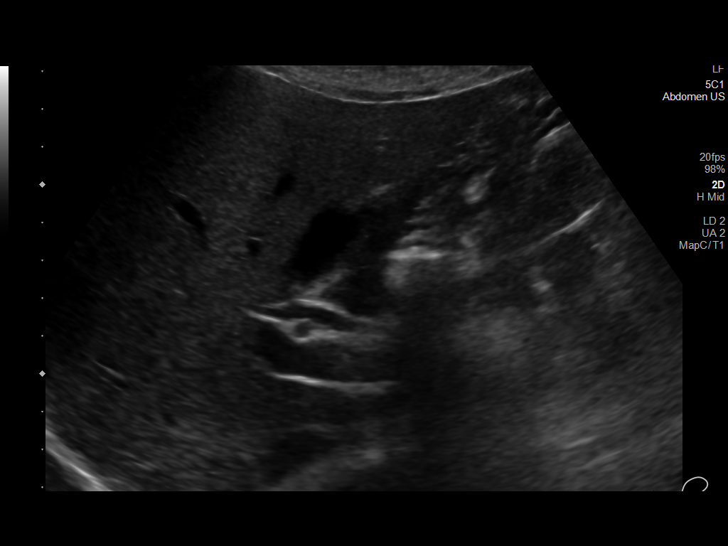
[im 39/117]
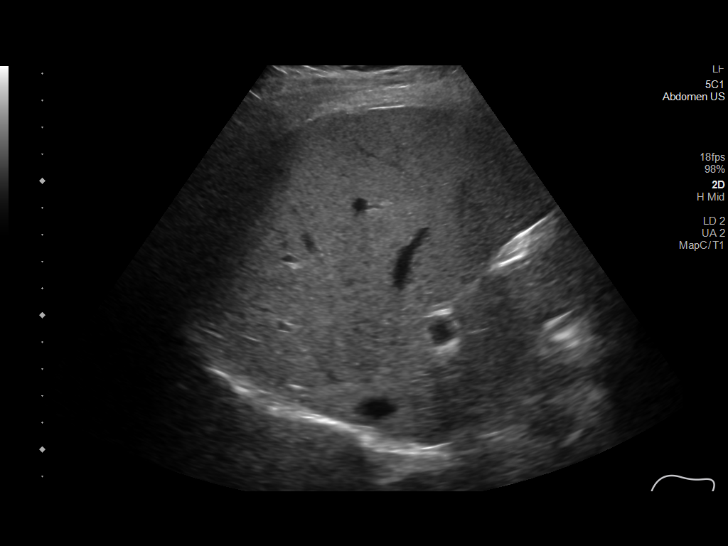
[im 44/117]
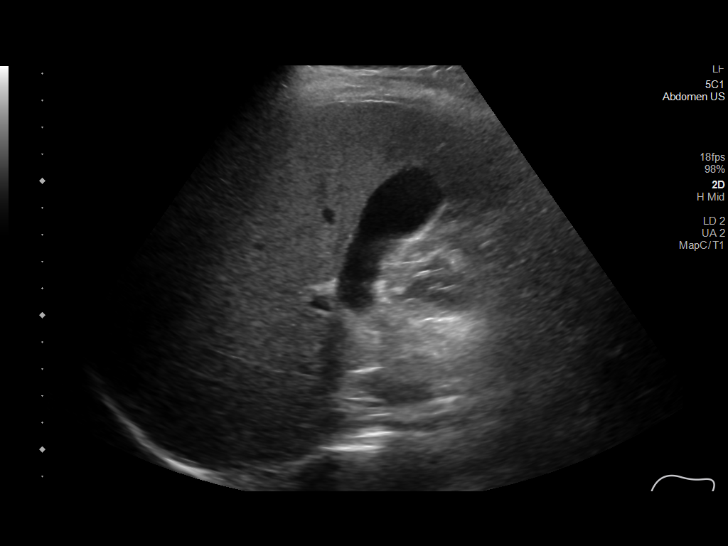
[im 54/117]
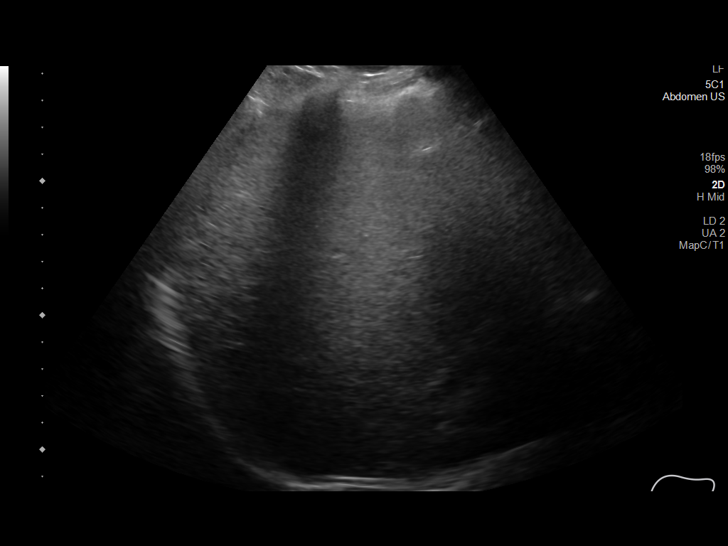
[im 63/117]
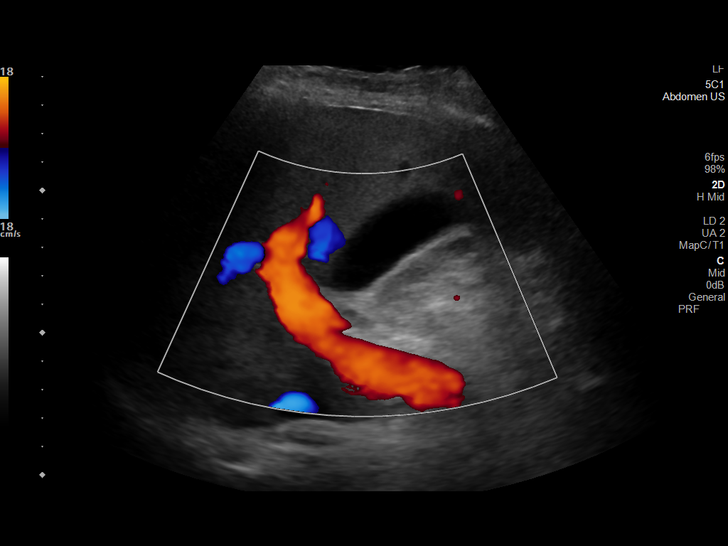
[im 73/117]
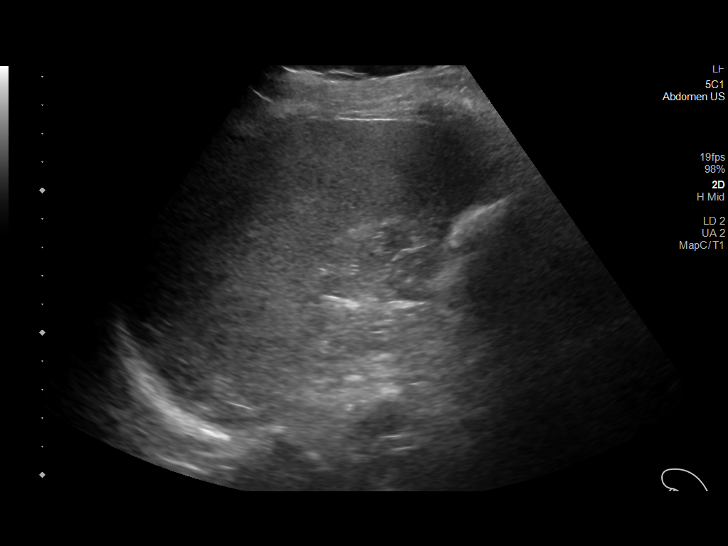
[im 78/117]
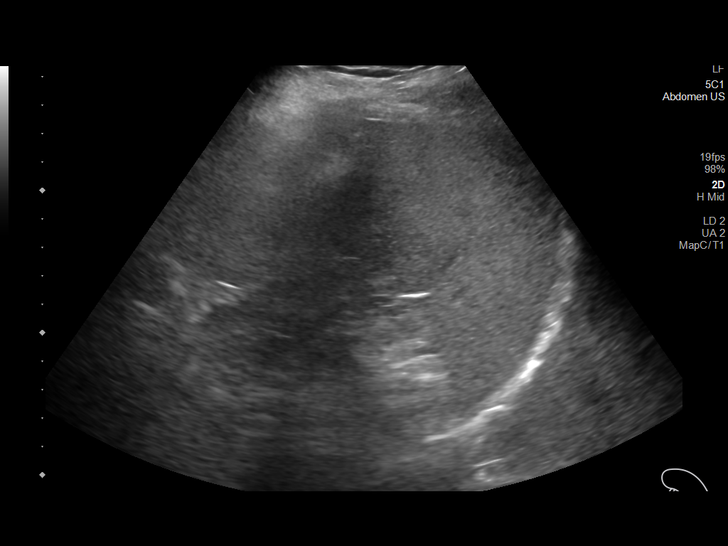
[im 88/117]
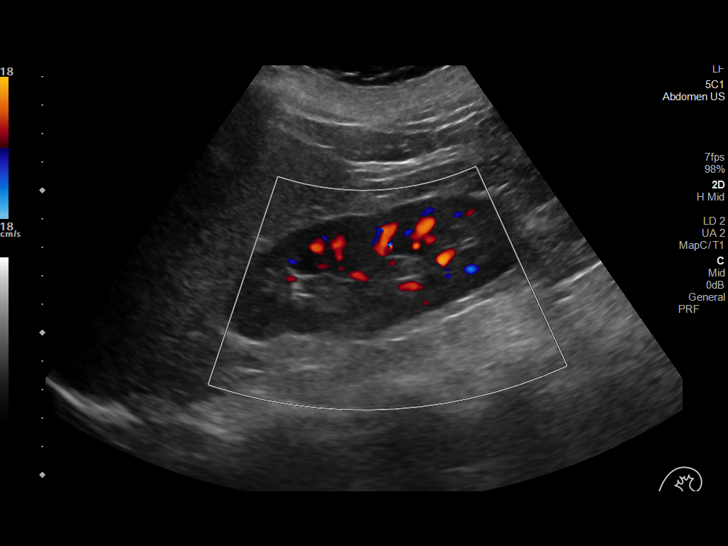
[im 97/117]
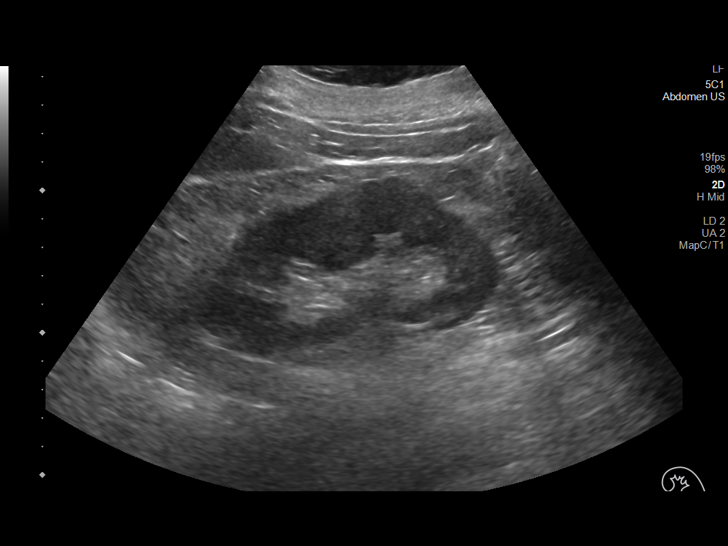
[im 107/117]
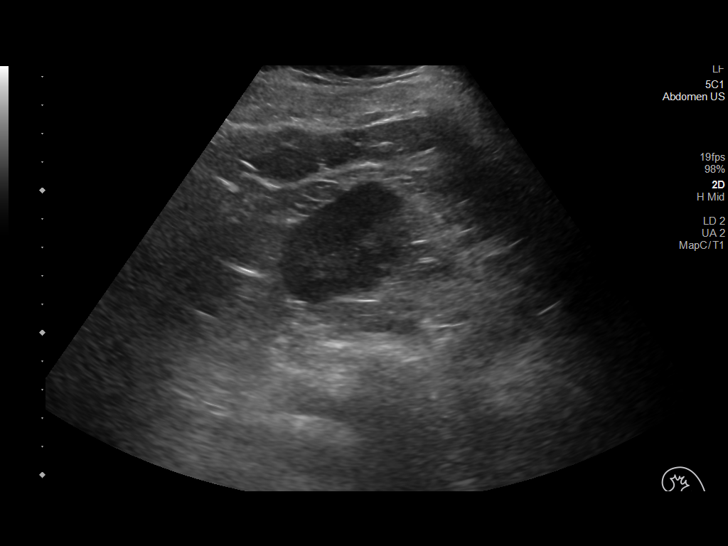
[im 117/117]
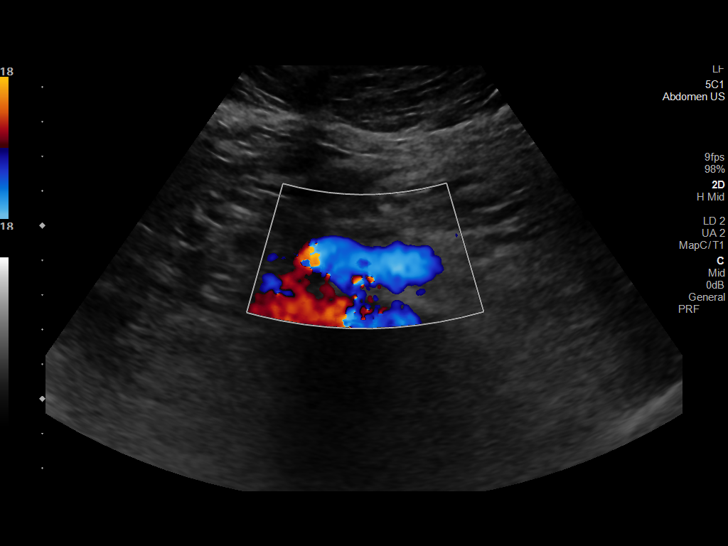

[14 of 25 positions shown; findings below may reference images not displayed]

FINDINGS: Gallbladder: No gallstones or wall thickening visualized. No
sonographic Murphy sign noted by sonographer.

Common bile duct: Diameter: 5 mm

Liver: No focal lesion identified. Diffusely increased parenchymal
echogenicity. Portal vein is patent on color Doppler imaging with
normal direction of blood flow towards the liver.

IVC: No abnormality visualized.

Pancreas: Visualized portion unremarkable.

Spleen: Size and appearance within normal limits.

Right Kidney: Length: 11.4 cm. Echogenicity within normal limits. No
mass or hydronephrosis visualized.

Left Kidney: Length: 10 point cm. Echogenicity within normal limits.
No mass or hydronephrosis visualized.

Abdominal aorta: No aneurysm visualized.

Other findings: None.
IMPRESSION: The echogenicity of the liver is increased. This is a nonspecific
finding but is most commonly seen with fatty infiltration of the
liver. There are no obvious focal liver lesions.
